# Patient Record
Sex: Female | Born: 1955
Health system: Southern US, Community
[De-identification: ages and names within clinical notes are randomized; demographics above are authoritative.]

## PROBLEM LIST (undated history)

## (undated) DIAGNOSIS — T7840XA Allergy, unspecified, initial encounter: Secondary | ICD-10-CM

## (undated) DIAGNOSIS — F329 Major depressive disorder, single episode, unspecified: Secondary | ICD-10-CM

## (undated) DIAGNOSIS — F419 Anxiety disorder, unspecified: Secondary | ICD-10-CM

## (undated) HISTORY — DX: Anxiety disorder, unspecified: F41.9

## (undated) HISTORY — DX: Major depressive disorder, single episode, unspecified: F32.9

## (undated) HISTORY — PX: TUBAL LIGATION: SHX77

## (undated) HISTORY — PX: COLONOSCOPY: SHX174

## (undated) HISTORY — DX: Allergy, unspecified, initial encounter: T78.40XA

---

## 1997-09-15 ENCOUNTER — Other Ambulatory Visit: Admission: RE | Admit: 1997-09-15 | Discharge: 1997-09-15 | Payer: Self-pay | Admitting: Obstetrics and Gynecology

## 1999-04-12 ENCOUNTER — Encounter: Admission: RE | Admit: 1999-04-12 | Discharge: 1999-04-12 | Payer: Self-pay | Admitting: Obstetrics and Gynecology

## 1999-04-12 ENCOUNTER — Encounter: Payer: Self-pay | Admitting: Obstetrics and Gynecology

## 1999-10-12 ENCOUNTER — Encounter: Payer: Self-pay | Admitting: Obstetrics and Gynecology

## 1999-10-12 ENCOUNTER — Encounter: Admission: RE | Admit: 1999-10-12 | Discharge: 1999-10-12 | Payer: Self-pay | Admitting: Obstetrics and Gynecology

## 2001-05-06 ENCOUNTER — Other Ambulatory Visit: Admission: RE | Admit: 2001-05-06 | Discharge: 2001-05-06 | Payer: Self-pay | Admitting: Obstetrics and Gynecology

## 2002-04-08 ENCOUNTER — Encounter: Payer: Self-pay | Admitting: Obstetrics and Gynecology

## 2002-04-08 ENCOUNTER — Encounter: Admission: RE | Admit: 2002-04-08 | Discharge: 2002-04-08 | Payer: Self-pay | Admitting: Obstetrics and Gynecology

## 2006-01-22 ENCOUNTER — Other Ambulatory Visit: Admission: RE | Admit: 2006-01-22 | Discharge: 2006-01-22 | Payer: Self-pay | Admitting: Family Medicine

## 2006-04-02 LAB — HM MAMMOGRAPHY: HM Mammogram: NORMAL

## 2006-04-02 LAB — HM COLONOSCOPY: HM Colonoscopy: NORMAL

## 2007-02-11 ENCOUNTER — Encounter: Payer: Self-pay | Admitting: Family Medicine

## 2007-03-19 ENCOUNTER — Other Ambulatory Visit: Admission: RE | Admit: 2007-03-19 | Discharge: 2007-03-19 | Payer: Self-pay | Admitting: Family Medicine

## 2007-08-01 LAB — CONVERTED CEMR LAB: Pap Smear: NORMAL

## 2009-01-27 ENCOUNTER — Ambulatory Visit: Payer: Self-pay | Admitting: Family Medicine

## 2009-01-27 DIAGNOSIS — F329 Major depressive disorder, single episode, unspecified: Secondary | ICD-10-CM

## 2009-01-27 DIAGNOSIS — Z8659 Personal history of other mental and behavioral disorders: Secondary | ICD-10-CM

## 2009-01-27 DIAGNOSIS — F3289 Other specified depressive episodes: Secondary | ICD-10-CM

## 2009-01-27 HISTORY — DX: Other specified depressive episodes: F32.89

## 2009-01-27 HISTORY — DX: Major depressive disorder, single episode, unspecified: F32.9

## 2010-06-13 ENCOUNTER — Other Ambulatory Visit: Payer: Self-pay | Admitting: Family Medicine

## 2010-06-27 ENCOUNTER — Encounter: Payer: Self-pay | Admitting: Family Medicine

## 2010-06-29 ENCOUNTER — Ambulatory Visit (INDEPENDENT_AMBULATORY_CARE_PROVIDER_SITE_OTHER): Payer: Self-pay | Admitting: Family Medicine

## 2010-06-29 ENCOUNTER — Encounter: Payer: Self-pay | Admitting: Family Medicine

## 2010-06-29 VITALS — BP 120/80 | Temp 98.6°F | Ht 64.0 in | Wt 167.0 lb

## 2010-06-29 DIAGNOSIS — F329 Major depressive disorder, single episode, unspecified: Secondary | ICD-10-CM

## 2010-06-29 MED ORDER — CITALOPRAM HYDROBROMIDE 40 MG PO TABS: 40.0000 mg | ORAL_TABLET | Freq: Every day | ORAL | Status: DC

## 2010-06-29 NOTE — Progress Notes (Signed)
  Subjective:    Patient ID: Jenny Ewing, female    DOB: [birth year], 55 y.o.   MRN: [id number]  HPI Patient seen for medical followup. History of recurrent depression currently treated citalopram 40 mg daily. Over the years, she has tried several times coming off medication with recurrent depression each time. Current symptoms stable. She exercises 3-4 times per week. Good appetite. Sleep is normal. Denies side effects from medication. Occasionally skips a dose but for the most part compliant.   Review of Systems  Constitutional: Negative for activity change, appetite change, fatigue and unexpected weight change.  Respiratory: Negative for cough and shortness of breath.   Cardiovascular: Negative for chest pain.  Neurological: Negative for weakness.  Psychiatric/Behavioral: Negative for dysphoric mood. The patient is not nervous/anxious.        Objective:   Physical Exam  Constitutional: She is oriented to person, place, and time. She appears well-developed and well-nourished.  HENT:  Head: Normocephalic and atraumatic.  Neck: Neck supple. No thyromegaly present.  Cardiovascular: Normal rate and regular rhythm.   No murmur heard. Pulmonary/Chest: Effort normal and breath sounds normal. She has no wheezes. She has no rales.  Lymphadenopathy:    She has no cervical adenopathy.  Neurological: She is alert and oriented to person, place, and time.          Assessment & Plan:  Recurrent depression. Stable. Refill medication for one year

## 2010-08-25 ENCOUNTER — Ambulatory Visit (INDEPENDENT_AMBULATORY_CARE_PROVIDER_SITE_OTHER): Payer: Self-pay | Admitting: Internal Medicine

## 2010-08-25 DIAGNOSIS — J029 Acute pharyngitis, unspecified: Secondary | ICD-10-CM

## 2010-08-25 MED ORDER — CLINDAMYCIN HCL 300 MG PO CAPS: 300.0000 mg | ORAL_CAPSULE | Freq: Three times a day (TID) | ORAL | Status: AC

## 2010-08-26 ENCOUNTER — Encounter: Payer: Self-pay | Admitting: Internal Medicine

## 2010-08-26 DIAGNOSIS — J029 Acute pharyngitis, unspecified: Secondary | ICD-10-CM | POA: Insufficient documentation

## 2010-08-26 NOTE — Assessment & Plan Note (Signed)
Begin antibiotic therapy. Followup if no improvement or worsening. 

## 2010-08-26 NOTE — Progress Notes (Signed)
  Subjective:    Patient ID: Jenny Ewing, female    DOB: 15-Apr-1955, 55 y.o.   MRN: [id number]  HPI Patient presents to clinic for evaluation of sore throat. Patient notes six-day history of sore throat with subjective fevers and nasal congestion/drainage. Has had sick exposure with positive strep exposure. No alleviating or exacerbating factors. No other complaints.  Reviewed past medical history, medications and allergies    Review of Systems  Constitutional: Positive for fever. Negative for chills.  HENT: Positive for congestion and rhinorrhea.   Respiratory: Negative for cough and wheezing.        Objective:   Physical Exam  Nursing note and vitals reviewed. Constitutional: She appears well-developed and well-nourished. No distress.  HENT:  Head: Normocephalic and atraumatic.  Right Ear: Tympanic membrane, external ear and ear canal normal.  Left Ear: Tympanic membrane, external ear and ear canal normal.  Nose: Nose normal.  Mouth/Throat: Uvula is midline and mucous membranes are normal. Oropharyngeal exudate and posterior oropharyngeal erythema present. No posterior oropharyngeal edema or tonsillar abscesses.  Eyes: Conjunctivae are normal. No scleral icterus.  Neck: Neck supple.  Lymphadenopathy:    She has no cervical adenopathy.  Neurological: She is alert.  Skin: Skin is warm and dry. She is not diaphoretic.  Psychiatric: She has a normal mood and affect.          Assessment & Plan:

## 2011-10-11 ENCOUNTER — Other Ambulatory Visit: Payer: Self-pay | Admitting: Family Medicine

## 2011-12-17 ENCOUNTER — Encounter: Payer: Self-pay | Admitting: Family Medicine

## 2011-12-17 ENCOUNTER — Ambulatory Visit (INDEPENDENT_AMBULATORY_CARE_PROVIDER_SITE_OTHER): Payer: Self-pay | Admitting: Family Medicine

## 2011-12-17 VITALS — BP 130/80 | Temp 98.0°F | Wt 159.0 lb

## 2011-12-17 DIAGNOSIS — F329 Major depressive disorder, single episode, unspecified: Secondary | ICD-10-CM

## 2011-12-17 DIAGNOSIS — F3289 Other specified depressive episodes: Secondary | ICD-10-CM

## 2011-12-17 MED ORDER — CITALOPRAM HYDROBROMIDE 40 MG PO TABS: 40.0000 mg | ORAL_TABLET | Freq: Every day | ORAL | Status: DC

## 2011-12-17 NOTE — Progress Notes (Signed)
  Subjective:    Patient ID: Jenny Ewing, female    DOB: [birth year], 57 y.o.   MRN: [id number]  HPI  History of recurrent depression. Stable on Celexa 40 mg daily. Requesting refills. She feels her anxiety and depression symptoms are stable. She has had some difficulties losing weight over the past year. She is exercising with walking fairly regularly. She has actually lost about 8 pounds due to her efforts since last year. Overall feels well. No side effects from medication. Compliant with therapy. She is very reluctant to discontinue medication. She has tried tapering in the past without success.  She has not had well visit or complete physical in some time but has no insurance and hesitates to schedule this time  Past Medical History  Diagnosis Date  . DEPRESSION 01/27/2009   No past surgical history on file.  reports that she has never smoked. She does not have any smokeless tobacco history on file. Her alcohol and drug histories not on file. family history includes Cancer in her other; Hypertension in her other; and Stroke in her other. Allergies  Allergen Reactions  . Penicillins     REACTION: Hives      Review of Systems  Constitutional: Negative for appetite change and unexpected weight change.  Respiratory: Negative for shortness of breath.   Cardiovascular: Negative for chest pain.  Psychiatric/Behavioral: Negative for dysphoric mood. The patient is not nervous/anxious.        Objective:   Physical Exam  Constitutional: She appears well-developed and well-nourished.  Neck: Neck supple. No thyromegaly present.  Cardiovascular: Normal rate and regular rhythm.   Pulmonary/Chest: Effort normal and breath sounds normal. No respiratory distress. She has no wheezes. She has no rales.  Psychiatric: She has a normal mood and affect. Her behavior is normal.          Assessment & Plan:  History of recurrent depression. Stable. Refill Celexa for one year.

## 2013-02-10 ENCOUNTER — Other Ambulatory Visit: Payer: Self-pay

## 2013-02-10 MED ORDER — CITALOPRAM HYDROBROMIDE 40 MG PO TABS: 40.0000 mg | ORAL_TABLET | Freq: Every day | ORAL | Status: DC

## 2013-03-19 ENCOUNTER — Ambulatory Visit (INDEPENDENT_AMBULATORY_CARE_PROVIDER_SITE_OTHER): Payer: Self-pay | Admitting: Family Medicine

## 2013-03-19 ENCOUNTER — Encounter: Payer: Self-pay | Admitting: Family Medicine

## 2013-03-19 VITALS — BP 130/74 | HR 75 | Temp 98.6°F | Wt 173.0 lb

## 2013-03-19 DIAGNOSIS — F329 Major depressive disorder, single episode, unspecified: Secondary | ICD-10-CM

## 2013-03-19 MED ORDER — CITALOPRAM HYDROBROMIDE 40 MG PO TABS: 40.0000 mg | ORAL_TABLET | Freq: Every day | ORAL | Status: DC

## 2013-03-19 NOTE — Progress Notes (Signed)
   Subjective:    Patient ID: Jenny Ewing, female    DOB: [birth year], 57 y.o.   MRN: [id number]  HPI Patient is here for followup regarding recurrent depression. For several years she has been on Celexa 40 mg daily. Over the years, she has tried several times tapering off but has had recurrent depressive symptoms. No history of any chronic anxiety. No sleep disturbance. Feels well at this time. No depressed mood. No anhedonia. No suicidal thoughts. She denies any side effects from medication. She's compliant with therapy.  She's not had health maintenance exam in several years but has had lack of insurance coverage. This changes in January of next year and she plans to get physical scheduled then.  Past Medical History  Diagnosis Date  . DEPRESSION 01/27/2009   No past surgical history on file.  reports that she has never smoked. She does not have any smokeless tobacco history on file. Her alcohol and drug histories are not on file. family history includes Cancer in her other; Hypertension in her other; Stroke in her other. Allergies  Allergen Reactions  . Penicillins     REACTION: Hives      Review of Systems  Constitutional: Negative for appetite change and unexpected weight change.  Respiratory: Negative for cough and shortness of breath.   Cardiovascular: Negative for chest pain.  Neurological: Negative for dizziness.  Psychiatric/Behavioral: Negative for dysphoric mood and agitation.       Objective:   Physical Exam  Constitutional: She appears well-developed and well-nourished.  Neck: Neck supple. No thyromegaly present.  Cardiovascular: Normal rate and regular rhythm.   Pulmonary/Chest: Effort normal and breath sounds normal. No respiratory distress. She has no wheezes. She has no rales.  Psychiatric: She has a normal mood and affect. Her behavior is normal.          Assessment & Plan:  History of recurrent depression. Currently stable on Celexa. She's tried  tapering several times without success. Refill for one year. She'll schedule complete physical after first of the year.

## 2013-03-19 NOTE — Progress Notes (Signed)
Pre visit review using our clinic review tool, if applicable. No additional management support is needed unless otherwise documented below in the visit note. 

## 2013-06-10 ENCOUNTER — Encounter: Payer: Self-pay | Admitting: Family Medicine

## 2013-06-10 ENCOUNTER — Other Ambulatory Visit (HOSPITAL_COMMUNITY)
Admission: RE | Admit: 2013-06-10 | Discharge: 2013-06-10 | Disposition: A | Discharge status: Home / Self Care | Payer: 59 | Source: Ambulatory Visit | Attending: Family Medicine | Admitting: Family Medicine

## 2013-06-10 ENCOUNTER — Ambulatory Visit (INDEPENDENT_AMBULATORY_CARE_PROVIDER_SITE_OTHER): Payer: 59 | Admitting: Family Medicine

## 2013-06-10 VITALS — BP 132/82 | HR 89 | Temp 98.5°F | Ht 64.0 in | Wt 172.0 lb

## 2013-06-10 DIAGNOSIS — Z0000 Encounter for general adult medical examination without abnormal findings: Secondary | ICD-10-CM

## 2013-06-10 DIAGNOSIS — Z23 Encounter for immunization: Secondary | ICD-10-CM

## 2013-06-10 DIAGNOSIS — Z01419 Encounter for gynecological examination (general) (routine) without abnormal findings: Secondary | ICD-10-CM | POA: Insufficient documentation

## 2013-06-10 LAB — CBC WITH DIFFERENTIAL/PLATELET
BASOS PCT: 0.5 % (ref 0.0–3.0)
Basophils Absolute: 0 10*3/uL (ref 0.0–0.1)
EOS PCT: 3.6 % (ref 0.0–5.0)
Eosinophils Absolute: 0.2 10*3/uL (ref 0.0–0.7)
HCT: 42.8 % (ref 36.0–46.0)
Hemoglobin: 14.4 g/dL (ref 12.0–15.0)
LYMPHS PCT: 31 % (ref 12.0–46.0)
Lymphs Abs: 1.6 10*3/uL (ref 0.7–4.0)
MCHC: 33.6 g/dL (ref 30.0–36.0)
MCV: 95.4 fl (ref 78.0–100.0)
MONO ABS: 0.4 10*3/uL (ref 0.1–1.0)
MONOS PCT: 8.6 % (ref 3.0–12.0)
NEUTROS PCT: 56.3 % (ref 43.0–77.0)
Neutro Abs: 2.9 10*3/uL (ref 1.4–7.7)
PLATELETS: 276 10*3/uL (ref 150.0–400.0)
RBC: 4.49 Mil/uL (ref 3.87–5.11)
RDW: 13 % (ref 11.5–14.6)
WBC: 5.1 10*3/uL (ref 4.5–10.5)

## 2013-06-10 LAB — BASIC METABOLIC PANEL
BUN: 16 mg/dL (ref 6–23)
CALCIUM: 9.3 mg/dL (ref 8.4–10.5)
CHLORIDE: 105 meq/L (ref 96–112)
CO2: 24 mEq/L (ref 19–32)
CREATININE: 0.7 mg/dL (ref 0.4–1.2)
GFR: 85.26 mL/min (ref >60.00)
Glucose, Bld: 92 mg/dL (ref 70–99)
Potassium: 4.1 mEq/L (ref 3.5–5.1)
Sodium: 138 mEq/L (ref 135–145)

## 2013-06-10 LAB — LIPID PANEL
CHOL/HDL RATIO: 3
CHOLESTEROL: 217 mg/dL — AB (ref 0–200)
HDL: 82.7 mg/dL (ref >39.00)
LDL CALC: 125 mg/dL — AB (ref 0–99)
Triglycerides: 46 mg/dL (ref 0.0–149.0)
VLDL: 9.2 mg/dL (ref 0.0–40.0)

## 2013-06-10 LAB — POCT URINALYSIS DIPSTICK
Bilirubin, UA: NEGATIVE
GLUCOSE UA: NEGATIVE
Ketones, UA: NEGATIVE
NITRITE UA: NEGATIVE
Protein, UA: NEGATIVE
Spec Grav, UA: >=1.03
UROBILINOGEN UA: 0.2
pH, UA: 5.5

## 2013-06-10 LAB — HEPATIC FUNCTION PANEL
ALBUMIN: 4.7 g/dL (ref 3.5–5.2)
ALT: 14 U/L (ref 0–35)
AST: 18 U/L (ref 0–37)
Alkaline Phosphatase: 75 U/L (ref 39–117)
Bilirubin, Direct: 0.1 mg/dL (ref 0.0–0.3)
TOTAL PROTEIN: 7.2 g/dL (ref 6.0–8.3)
Total Bilirubin: 0.6 mg/dL (ref 0.3–1.2)

## 2013-06-10 LAB — TSH: TSH: 2.48 u[IU]/mL (ref 0.35–5.50)

## 2013-06-10 NOTE — Addendum Note (Signed)
Addended by: Marcina Millard on: 06/10/2013 09:57 AM   Modules accepted: Orders

## 2013-06-10 NOTE — Patient Instructions (Signed)
Schedule mammogram. We will call you with Pap smear and lab results

## 2013-06-10 NOTE — Progress Notes (Signed)
   Subjective:    Patient ID: Jenny Ewing, female    DOB: 1956/01/21, 58 y.o.   MRN: [id number]  HPI  Patient here for complete physical. She had lack of insurance for several years and has not had a complete physical now in several years. She has history of depression which is stable on Celexa. She takes no other regular medications. She had colonoscopy in 2008 which was normal. She had one grandparent with colon cancer. Last Pap smear 2009. Last mammogram around 2008 or 2009. Patient is a nonsmoker.  Past Medical History  Diagnosis Date  . DEPRESSION 01/27/2009   No past surgical history on file.  reports that she has never smoked. She does not have any smokeless tobacco history on file. Her alcohol and drug histories are not on file. family history includes Cancer in her other; Hypertension in her father, mother, and other; Stroke in her other. Allergies  Allergen Reactions  . Penicillins     REACTION: Hives     Review of Systems  Constitutional: Negative for fever, activity change, appetite change, fatigue and unexpected weight change.  HENT: Negative for ear pain, hearing loss, sore throat and trouble swallowing.   Eyes: Negative for visual disturbance.  Respiratory: Negative for cough and shortness of breath.   Cardiovascular: Negative for chest pain and palpitations.  Gastrointestinal: Negative for abdominal pain, diarrhea, constipation and blood in stool.  Endocrine: Negative for polydipsia and polyuria.  Genitourinary: Negative for dysuria and hematuria.  Musculoskeletal: Negative for arthralgias, back pain and myalgias.  Skin: Negative for rash.  Neurological: Negative for dizziness, syncope and headaches.  Hematological: Negative for adenopathy.  Psychiatric/Behavioral: Negative for confusion and dysphoric mood.       Objective:   Physical Exam  Constitutional: She is oriented to person, place, and time. She appears well-developed and well-nourished.  HENT:    Head: Normocephalic and atraumatic.  Eyes: EOM are normal. Pupils are equal, round, and reactive to light.  Neck: Normal range of motion. Neck supple. No thyromegaly present.  Cardiovascular: Normal rate, regular rhythm and normal heart sounds.   No murmur heard. Pulmonary/Chest: Breath sounds normal. No respiratory distress. She has no wheezes. She has no rales.  Abdominal: Soft. Bowel sounds are normal. She exhibits no distension and no mass. There is no tenderness. There is no rebound and no guarding.  Genitourinary:  Breasts are symmetric with no mass. Pelvic exam normal external genitalia. Cervix normal in appearance. Pap smear obtained. Bimanual exam no mass. No tenderness.  Musculoskeletal: Normal range of motion. She exhibits no edema.  Lymphadenopathy:    She has no cervical adenopathy.  Neurological: She is alert and oriented to person, place, and time. She displays normal reflexes. No cranial nerve deficit.  Skin: No rash noted.  Psychiatric: She has a normal mood and affect. Her behavior is normal. Judgment and thought content normal.          Assessment & Plan:  Complete physical. Obtain screening lab work. Schedule mammogram. Pap smear obtained. Hemoccult cards given. Tetanus booster given

## 2013-06-10 NOTE — Progress Notes (Signed)
Pre visit review using our clinic review tool, if applicable. No additional management support is needed unless otherwise documented below in the visit note. 

## 2013-07-16 ENCOUNTER — Telehealth: Payer: Self-pay

## 2013-07-16 ENCOUNTER — Encounter: Payer: Self-pay | Admitting: Family Medicine

## 2013-07-16 DIAGNOSIS — Z1239 Encounter for other screening for malignant neoplasm of breast: Secondary | ICD-10-CM

## 2013-07-16 NOTE — Telephone Encounter (Signed)
yes

## 2013-07-16 NOTE — Telephone Encounter (Signed)
Pt states her insurance require referrals for mammograms. Is it okay to order a referral for mammogram.

## 2013-07-16 NOTE — Telephone Encounter (Signed)
Referral is ordered

## 2013-08-12 ENCOUNTER — Ambulatory Visit
Admission: RE | Admit: 2013-08-12 | Discharge: 2013-08-12 | Disposition: A | Discharge status: Home / Self Care | Payer: 59 | Source: Ambulatory Visit | Attending: Family Medicine | Admitting: Family Medicine

## 2013-08-12 DIAGNOSIS — Z1239 Encounter for other screening for malignant neoplasm of breast: Secondary | ICD-10-CM

## 2013-08-14 ENCOUNTER — Other Ambulatory Visit: Payer: Self-pay | Admitting: Family Medicine

## 2013-08-14 DIAGNOSIS — R928 Other abnormal and inconclusive findings on diagnostic imaging of breast: Secondary | ICD-10-CM

## 2013-09-03 ENCOUNTER — Ambulatory Visit
Admission: RE | Admit: 2013-09-03 | Discharge: 2013-09-03 | Disposition: A | Discharge status: Home / Self Care | Payer: 59 | Source: Ambulatory Visit | Attending: Family Medicine | Admitting: Family Medicine

## 2013-09-03 DIAGNOSIS — R928 Other abnormal and inconclusive findings on diagnostic imaging of breast: Secondary | ICD-10-CM

## 2013-09-21 ENCOUNTER — Telehealth: Payer: Self-pay | Admitting: Family Medicine

## 2013-09-21 DIAGNOSIS — Z808 Family history of malignant neoplasm of other organs or systems: Secondary | ICD-10-CM

## 2013-09-21 NOTE — Telephone Encounter (Signed)
Pt is requesting a referral to a dermatologist

## 2013-09-21 NOTE — Telephone Encounter (Signed)
OK to refer if she needs referral.  If she does not have insurance will not need referral.  She may have coverage now.

## 2013-09-21 NOTE — Telephone Encounter (Signed)
Referral is ordered

## 2014-08-04 ENCOUNTER — Ambulatory Visit (INDEPENDENT_AMBULATORY_CARE_PROVIDER_SITE_OTHER): Payer: 59 | Admitting: Family Medicine

## 2014-08-04 ENCOUNTER — Encounter: Payer: Self-pay | Admitting: Family Medicine

## 2014-08-04 VITALS — BP 126/78 | HR 77 | Temp 98.6°F | Wt 179.0 lb

## 2014-08-04 DIAGNOSIS — Z8659 Personal history of other mental and behavioral disorders: Secondary | ICD-10-CM | POA: Diagnosis not present

## 2014-08-04 MED ORDER — CITALOPRAM HYDROBROMIDE 40 MG PO TABS: 40.0000 mg | ORAL_TABLET | Freq: Every day | ORAL | Status: DC

## 2014-08-04 NOTE — Progress Notes (Signed)
   Subjective:    Patient ID: Jenny Ewing, female    DOB: 02/13/1956, 59 y.o.   MRN: [id number]  HPI Patient here for follow-up regarding recurrent depression. Treated with Celexa 40 mg daily. She has tried tapering back off this medication in the past without success and she denies any anxiety symptoms. No side effects. She had complete physical last year which was unremarkable.  Past Medical History  Diagnosis Date  . DEPRESSION 01/27/2009   No past surgical history on file.  reports that she has never smoked. She does not have any smokeless tobacco history on file. Her alcohol and drug histories are not on file. family history includes Cancer in her other; Hypertension in her father, mother, and other; Stroke in her other. Allergies  Allergen Reactions  . Penicillins     REACTION: Hives      Review of Systems  Constitutional: Negative for appetite change and unexpected weight change.  Psychiatric/Behavioral: Negative for dysphoric mood and agitation. The patient is not nervous/anxious.        Objective:   Physical Exam  Constitutional: She is oriented to person, place, and time. She appears well-developed and well-nourished.  Cardiovascular: Normal rate and regular rhythm.   Pulmonary/Chest: Effort normal and breath sounds normal. No respiratory distress. She has no wheezes. She has no rales.  Neurological: She is alert and oriented to person, place, and time. No cranial nerve deficit.  Psychiatric: She has a normal mood and affect. Her behavior is normal. Judgment and thought content normal.          Assessment & Plan:  History of recurrent depression. Stable. Refill Celexa for one year. Consider complete physical by next year

## 2014-08-04 NOTE — Progress Notes (Signed)
Pre visit review using our clinic review tool, if applicable. No additional management support is needed unless otherwise documented below in the visit note. 

## 2015-11-16 ENCOUNTER — Ambulatory Visit (INDEPENDENT_AMBULATORY_CARE_PROVIDER_SITE_OTHER): Payer: 59 | Admitting: Family Medicine

## 2015-11-16 VITALS — BP 130/80 | HR 83 | Temp 98.8°F | Ht 64.0 in | Wt 179.5 lb

## 2015-11-16 DIAGNOSIS — Z8659 Personal history of other mental and behavioral disorders: Secondary | ICD-10-CM | POA: Diagnosis not present

## 2015-11-16 MED ORDER — CITALOPRAM HYDROBROMIDE 40 MG PO TABS: 40.0000 mg | ORAL_TABLET | Freq: Every day | ORAL | 3 refills | Status: DC

## 2015-11-16 NOTE — Progress Notes (Signed)
Subjective:     Patient ID: Jenny Ewing, female   DOB: 1955/05/01, 60 y.o.   MRN: [id number]  HPI Patient has history of recurrent depression. She has been for several years on citalopram 40 mg daily. She's tried multiple times tapering off but has had recurrent symptoms. She is here for refills. She feels her anxiety symptoms are currently very stable. No anhedonia, decreased concentration, low motivation, sleep disturbance, or other concern.  She'll be due for repeat colonoscopy by next year. She currently has poor coverage and does not wish to look at things like shingles vaccine at this time.  Past Medical History:  Diagnosis Date  . DEPRESSION 01/27/2009   No past surgical history on file.  reports that she has never smoked. She does not have any smokeless tobacco history on file. Her alcohol and drug histories are not on file. family history includes Cancer in her other; Hypertension in her father, mother, and other; Stroke in her other. Allergies  Allergen Reactions  . Penicillins     REACTION: Hives     Review of Systems  Respiratory: Negative for shortness of breath.   Cardiovascular: Negative for chest pain.  Psychiatric/Behavioral: Negative for dysphoric mood and suicidal ideas.       Objective:   Physical Exam  Constitutional: She appears well-developed and well-nourished.  Neck: Neck supple. No thyromegaly present.  Cardiovascular: Normal rate and regular rhythm.   Pulmonary/Chest: Effort normal and breath sounds normal. No respiratory distress. She has no wheezes. She has no rales.  Psychiatric: She has a normal mood and affect. Her behavior is normal. Judgment and thought content normal.       Assessment:     History of recurrent depression. Stable on Celexa    Plan:     -Refill medication for one year. -Follow-up sooner as needed  Eulas Post MD [facility] at [facility]

## 2015-11-16 NOTE — Progress Notes (Signed)
Pre visit review using our clinic review tool, if applicable. No additional management support is needed unless otherwise documented below in the visit note. 

## 2016-12-20 ENCOUNTER — Encounter: Payer: Self-pay | Admitting: Family Medicine

## 2017-03-07 ENCOUNTER — Other Ambulatory Visit: Payer: Self-pay | Admitting: Family Medicine

## 2017-03-07 NOTE — Telephone Encounter (Signed)
Copied from [place]. Topic: Quick Communication - See Telephone Encounter >> Mar 07, 2017  3:55 PM Synthia Innocent wrote: CRM for notification. See Telephone encounter for: Requesting refill on citalopram (CELEXA) 40 MG tablet, Kristopher Oppenheim [place]  03/07/17.

## 2017-03-12 MED ORDER — CITALOPRAM HYDROBROMIDE 40 MG PO TABS: 40.0000 mg | ORAL_TABLET | Freq: Every day | ORAL | 0 refills | Status: DC

## 2017-03-12 NOTE — Telephone Encounter (Signed)
Rx refilled through appt in January. She will need to keep appt for further refills.  Request was sent to [facility] Nurse Triage on 03/07/17 and not forwarded to our office until today (03/12/17).

## 2017-03-12 NOTE — Telephone Encounter (Signed)
Celexa last refilled: 11/16/15 for 90 tablets x 3 refills. This rx would have lasted until August 2018.  Pt has an office visit: 04/22/17.  Routing to provider.

## 2017-03-12 NOTE — Telephone Encounter (Signed)
Pt following up on celexa refillpt called 12/06 and this has not been addressed.  Pt states she has been out several days.

## 2017-04-22 ENCOUNTER — Ambulatory Visit (INDEPENDENT_AMBULATORY_CARE_PROVIDER_SITE_OTHER): Payer: PRIVATE HEALTH INSURANCE | Admitting: Family Medicine

## 2017-04-22 ENCOUNTER — Encounter: Payer: Self-pay | Admitting: Family Medicine

## 2017-04-22 VITALS — BP 120/80 | HR 100 | Temp 98.3°F | Ht 65.0 in | Wt 168.5 lb

## 2017-04-22 DIAGNOSIS — Z0000 Encounter for general adult medical examination without abnormal findings: Secondary | ICD-10-CM | POA: Diagnosis not present

## 2017-04-22 LAB — CBC WITH DIFFERENTIAL/PLATELET
BASOS PCT: 0.4 % (ref 0.0–3.0)
Basophils Absolute: 0 10*3/uL (ref 0.0–0.1)
Eosinophils Absolute: 0.1 10*3/uL (ref 0.0–0.7)
Eosinophils Relative: 2.3 % (ref 0.0–5.0)
HCT: 45.2 % (ref 36.0–46.0)
HEMOGLOBIN: 15 g/dL (ref 12.0–15.0)
Lymphocytes Relative: 25.8 % (ref 12.0–46.0)
Lymphs Abs: 1.5 10*3/uL (ref 0.7–4.0)
MCHC: 33.2 g/dL (ref 30.0–36.0)
MCV: 94.4 fl (ref 78.0–100.0)
MONO ABS: 0.5 10*3/uL (ref 0.1–1.0)
MONOS PCT: 8.4 % (ref 3.0–12.0)
Neutro Abs: 3.7 10*3/uL (ref 1.4–7.7)
Neutrophils Relative %: 63.1 % (ref 43.0–77.0)
Platelets: 316 10*3/uL (ref 150.0–400.0)
RBC: 4.79 Mil/uL (ref 3.87–5.11)
RDW: 13.1 % (ref 11.5–15.5)
WBC: 5.9 10*3/uL (ref 4.0–10.5)

## 2017-04-22 LAB — HEPATIC FUNCTION PANEL
ALBUMIN: 4.6 g/dL (ref 3.5–5.2)
ALK PHOS: 79 U/L (ref 39–117)
ALT: 12 U/L (ref 0–35)
AST: 17 U/L (ref 0–37)
Bilirubin, Direct: 0.1 mg/dL (ref 0.0–0.3)
Total Bilirubin: 0.7 mg/dL (ref 0.2–1.2)
Total Protein: 6.6 g/dL (ref 6.0–8.3)

## 2017-04-22 LAB — LIPID PANEL
CHOLESTEROL: 200 mg/dL (ref 0–200)
HDL: 70.7 mg/dL (ref >39.00)
LDL Cholesterol: 116 mg/dL — ABNORMAL HIGH (ref 0–99)
NonHDL: 128.85
Total CHOL/HDL Ratio: 3
Triglycerides: 64 mg/dL (ref 0.0–149.0)
VLDL: 12.8 mg/dL (ref 0.0–40.0)

## 2017-04-22 LAB — BASIC METABOLIC PANEL
BUN: 18 mg/dL (ref 6–23)
CHLORIDE: 102 meq/L (ref 96–112)
CO2: 27 mEq/L (ref 19–32)
Calcium: 9.8 mg/dL (ref 8.4–10.5)
Creatinine, Ser: 0.74 mg/dL (ref 0.40–1.20)
GFR: 84.18 mL/min (ref >60.00)
Glucose, Bld: 72 mg/dL (ref 70–99)
Potassium: 4.3 mEq/L (ref 3.5–5.1)
SODIUM: 140 meq/L (ref 135–145)

## 2017-04-22 LAB — TSH: TSH: 2.83 u[IU]/mL (ref 0.35–4.50)

## 2017-04-22 MED ORDER — CITALOPRAM HYDROBROMIDE 40 MG PO TABS: 40.0000 mg | ORAL_TABLET | Freq: Every day | ORAL | 3 refills | Status: DC

## 2017-04-22 NOTE — Patient Instructions (Signed)
Consider Colonoscopy vs Cologuard and let me know after checking with insurance. Set up repeat Mammogram Consider new shingles vaccine (Shingrix).

## 2017-04-22 NOTE — Progress Notes (Signed)
Subjective:     Patient ID: Jenny Ewing, female   DOB: 08/01/1955, 62 y.o.   MRN: [id number]  HPI Patient here for physical exam. She has history of recurrent depression and is on Celexa. Mood is stable. She takes no other medications. She is overdue for several health maintenance items. She declines flu vaccine. No mammogram in over 3 years. Last colonoscopy over 10 years ago. No history of shingles vaccine. Low risk for hepatitis C. Last Pap smear little over 3 years ago. Low risk.  Never smoked. Appetite and weight are stable. Family history reviewed and as below  Chronic intermittent low back pain and she is followed by a chiropractor for that.  Past Medical History:  Diagnosis Date  . DEPRESSION 01/27/2009   No past surgical history on file.  reports that  has never smoked. she has never used smokeless tobacco. Her alcohol and drug histories are not on file. family history includes Cancer in her other; Hypertension in her father, mother, and other; Stroke in her other. Allergies  Allergen Reactions  . Penicillins     REACTION: Hives     Review of Systems  Constitutional: Negative for activity change, appetite change, fatigue, fever and unexpected weight change.  HENT: Negative for ear pain, hearing loss, sore throat and trouble swallowing.   Eyes: Negative for visual disturbance.  Respiratory: Negative for cough and shortness of breath.   Cardiovascular: Negative for chest pain and palpitations.  Gastrointestinal: Negative for abdominal pain, blood in stool, constipation and diarrhea.  Endocrine: Negative for polydipsia and polyuria.  Genitourinary: Negative for dysuria and hematuria.  Musculoskeletal: Positive for back pain. Negative for arthralgias and myalgias.  Skin: Negative for rash.  Neurological: Negative for dizziness, syncope and headaches.  Hematological: Negative for adenopathy.  Psychiatric/Behavioral: Negative for confusion and dysphoric mood.        Objective:   Physical Exam  Constitutional: She is oriented to person, place, and time. She appears well-developed and well-nourished.  HENT:  Head: Normocephalic and atraumatic.  Eyes: EOM are normal. Pupils are equal, round, and reactive to light.  Neck: Normal range of motion. Neck supple. No thyromegaly present.  Cardiovascular: Normal rate, regular rhythm and normal heart sounds.  No murmur heard. Pulmonary/Chest: Breath sounds normal. No respiratory distress. She has no wheezes. She has no rales.  Abdominal: Soft. Bowel sounds are normal. She exhibits no distension and no mass. There is no tenderness. There is no rebound and no guarding.  Musculoskeletal: Normal range of motion. She exhibits no edema.  Lymphadenopathy:    She has no cervical adenopathy.  Neurological: She is alert and oriented to person, place, and time. She displays normal reflexes. No cranial nerve deficit.  Skin: No rash noted.  Psychiatric: She has a normal mood and affect. Her behavior is normal. Judgment and thought content normal.       Assessment:     Physical exam. Several health maintenance issues addressed as below    Plan:     -Obtain screening labs including hepatitis C antibody -Flu vaccine recommended but declined -We recommended either colonoscopy or cologuard. Patient was given information regarding cologuard. She will check with insurance coverage first -Set up repeat mammogram -Discussed new shingles vaccine and she will check on coverage -We discussed repeat Pap smear. She declines at this time but agrees to consider by next year. She is low risk.  Eulas Post MD [facility] at [facility]

## 2017-04-23 LAB — HEPATITIS C ANTIBODY
Hepatitis C Ab: NONREACTIVE
SIGNAL TO CUT-OFF: 0.01 (ref <1.00)

## 2017-07-28 ENCOUNTER — Observation Stay (HOSPITAL_COMMUNITY)
Admission: EM | Admit: 2017-07-28 | Discharge: 2017-07-29 | Disposition: A | Discharge status: Home / Self Care | Payer: PRIVATE HEALTH INSURANCE | Attending: Surgery | Admitting: Surgery

## 2017-07-28 ENCOUNTER — Encounter (HOSPITAL_COMMUNITY): Payer: Self-pay | Admitting: Emergency Medicine

## 2017-07-28 ENCOUNTER — Other Ambulatory Visit: Payer: Self-pay

## 2017-07-28 DIAGNOSIS — Z79899 Other long term (current) drug therapy: Secondary | ICD-10-CM | POA: Diagnosis not present

## 2017-07-28 DIAGNOSIS — F329 Major depressive disorder, single episode, unspecified: Secondary | ICD-10-CM | POA: Diagnosis not present

## 2017-07-28 DIAGNOSIS — K3580 Unspecified acute appendicitis: Secondary | ICD-10-CM | POA: Diagnosis present

## 2017-07-28 DIAGNOSIS — Z880 Allergy status to penicillin: Secondary | ICD-10-CM | POA: Insufficient documentation

## 2017-07-28 DIAGNOSIS — K5730 Diverticulosis of large intestine without perforation or abscess without bleeding: Secondary | ICD-10-CM | POA: Insufficient documentation

## 2017-07-28 DIAGNOSIS — K35891 Other acute appendicitis without perforation, with gangrene: Principal | ICD-10-CM | POA: Insufficient documentation

## 2017-07-28 DIAGNOSIS — K3589 Other acute appendicitis without perforation or gangrene: Secondary | ICD-10-CM

## 2017-07-28 LAB — CBC
HEMATOCRIT: 42.3 % (ref 36.0–46.0)
HEMOGLOBIN: 13.5 g/dL (ref 12.0–15.0)
MCH: 31 pg (ref 26.0–34.0)
MCHC: 31.9 g/dL (ref 30.0–36.0)
MCV: 97 fL (ref 78.0–100.0)
Platelets: 272 10*3/uL (ref 150–400)
RBC: 4.36 MIL/uL (ref 3.87–5.11)
RDW: 13.5 % (ref 11.5–15.5)
WBC: 13.8 10*3/uL — ABNORMAL HIGH (ref 4.0–10.5)

## 2017-07-28 LAB — URINALYSIS, ROUTINE W REFLEX MICROSCOPIC
BACTERIA UA: NONE SEEN
BILIRUBIN URINE: NEGATIVE
Glucose, UA: NEGATIVE mg/dL
Ketones, ur: 80 mg/dL — AB
NITRITE: NEGATIVE
PROTEIN: 30 mg/dL — AB
SPECIFIC GRAVITY, URINE: 1.025 (ref 1.005–1.030)
pH: 5 (ref 5.0–8.0)

## 2017-07-28 LAB — COMPREHENSIVE METABOLIC PANEL
ALBUMIN: 4.1 g/dL (ref 3.5–5.0)
ALT: 17 U/L (ref 14–54)
ANION GAP: 19 — AB (ref 5–15)
AST: 20 U/L (ref 15–41)
Alkaline Phosphatase: 64 U/L (ref 38–126)
BUN: 11 mg/dL (ref 6–20)
CHLORIDE: 102 mmol/L (ref 101–111)
CO2: 19 mmol/L — ABNORMAL LOW (ref 22–32)
Calcium: 8.9 mg/dL (ref 8.9–10.3)
Creatinine, Ser: 0.85 mg/dL (ref 0.44–1.00)
GFR calc Af Amer: >60 mL/min (ref >60)
GFR calc non Af Amer: >60 mL/min (ref >60)
Glucose, Bld: 87 mg/dL (ref 65–99)
POTASSIUM: 4.1 mmol/L (ref 3.5–5.1)
SODIUM: 140 mmol/L (ref 135–145)
Total Bilirubin: 1.2 mg/dL (ref 0.3–1.2)
Total Protein: 6.8 g/dL (ref 6.5–8.1)

## 2017-07-28 LAB — LIPASE, BLOOD: LIPASE: 18 U/L (ref 11–51)

## 2017-07-28 MED ORDER — MORPHINE SULFATE (PF) 4 MG/ML IV SOLN
4.0000 mg | Freq: Once | INTRAVENOUS | Status: AC
  Administered 2017-07-28: 4 mg via INTRAVENOUS
  Filled 2017-07-28: qty 1

## 2017-07-28 MED ORDER — OXYCODONE-ACETAMINOPHEN 5-325 MG PO TABS
1.0000 | ORAL_TABLET | ORAL | Status: DC | PRN
  Administered 2017-07-28: 1 via ORAL
  Filled 2017-07-28: qty 1

## 2017-07-28 MED ORDER — ONDANSETRON HCL 4 MG/2ML IJ SOLN
4.0000 mg | Freq: Once | INTRAMUSCULAR | Status: AC
  Administered 2017-07-28: 4 mg via INTRAVENOUS
  Filled 2017-07-28: qty 2

## 2017-07-28 NOTE — ED Provider Notes (Signed)
[facility] EMERGENCY DEPARTMENT Provider Note   CSN: [id number] Arrival date & time: 07/28/17  1600     History   Chief Complaint Chief Complaint  Patient presents with  . Abdominal Pain    HPI Jenny Ewing is a 62 y.o. female presenting for evaluation of abdominal pain.  Patient states that when she woke up this morning, she had lower abdominal pain.  It worsened throughout the day, and is now radiating around to her back.  She reports pain is a constant cramping pain with intermittent sharp pains.  Nothing has made it better, movement makes it worse.  She denies history of similar.  She states she is had chills without fever.  She has associated nausea without vomiting.  She denies history of abdominal problems, or prior history of abdominal surgeries.  Pain is worse in the right lower side.  No change with urination or bowel movements.  She denies dysuria, hematuria, or urinary frequency.  No blood in her stool.  She denies recent travel.  No sick contacts.  She denies vaginal discharge or bleeding. No HA's, CP, SOB, cough.   HPI  Past Medical History:  Diagnosis Date  . DEPRESSION 01/27/2009    Patient Active Problem List   Diagnosis Date Noted  . Pharyngitis 08/26/2010  . History of depression 01/27/2009    No past surgical history on file.   OB History   None      Home Medications    Prior to Admission medications   Medication Sig Start Date End Date Taking? Authorizing Provider  aspirin 81 MG tablet Take 81 mg by mouth daily.      [provider]  citalopram (CELEXA) 40 MG tablet Take 1 tablet (40 mg total) by mouth daily. 04/22/17   Burchette, Alinda Sierras, MD  fish oil-omega-3 fatty acids 1000 MG capsule Take 2 g by mouth daily.      [provider]  Multiple Vitamins-Minerals (WOMENS MULTI VITAMIN & MINERAL PO) Take by mouth daily.      [provider]    Family History Family History  Problem Relation Age of  Onset  . Cancer Other        colon  . Hypertension Other   . Stroke Other   . Hypertension Mother   . Hypertension Father     Social History Social History   Tobacco Use  . Smoking status: Never Smoker  . Smokeless tobacco: Never Used  Substance Use Topics  . Alcohol use: Not on file  . Drug use: Not on file     Allergies   Penicillins   Review of Systems Review of Systems  Gastrointestinal: Positive for abdominal pain and nausea.  All other systems reviewed and are negative.    Physical Exam Updated Vital Signs BP 118/69 (BP Location: Right Arm)   Pulse 79   Temp 98.3 F (36.8 C) (Oral)   Resp 18   SpO2 99%   Physical Exam  Constitutional: She is oriented to person, place, and time. She appears well-developed and well-nourished. No distress.  Patient appears uncomfortable due to pain.  No acute distress.  HENT:  Head: Normocephalic and atraumatic.  Eyes: Pupils are equal, round, and reactive to light. Conjunctivae and EOM are normal.  Neck: Normal range of motion. Neck supple.  Cardiovascular: Normal rate, regular rhythm and intact distal pulses.  Pulmonary/Chest: Effort normal and breath sounds normal. No respiratory distress. She has no wheezes.  Abdominal: Soft. Bowel  sounds are normal. She exhibits no distension and no mass. There is tenderness in the right lower quadrant, suprapubic area and left lower quadrant. There is guarding and tenderness at McBurney's point. There is no rigidity, no rebound and no CVA tenderness.  Tenderness to palpation of lower abdomen, worse in the right lower quadrant.  Positive guarding.  No rigidity or distention.  Negative rebound.  Negative psoas sign.  Musculoskeletal: Normal range of motion.  Neurological: She is alert and oriented to person, place, and time.  Skin: Skin is warm and dry.  Psychiatric: She has a normal mood and affect.  Nursing note and vitals reviewed.    ED Treatments / Results  Labs (all labs  ordered are listed, but only abnormal results are displayed) Labs Reviewed  COMPREHENSIVE METABOLIC PANEL - Abnormal; Notable for the following components:      Result Value   CO2 19 (*)    Anion gap 19 (*)    All other components within normal limits  CBC - Abnormal; Notable for the following components:   WBC 13.8 (*)    All other components within normal limits  URINALYSIS, ROUTINE W REFLEX MICROSCOPIC - Abnormal; Notable for the following components:   APPearance HAZY (*)    Hgb urine dipstick SMALL (*)    Ketones, ur 80 (*)    Protein, ur 30 (*)    Leukocytes, UA SMALL (*)    All other components within normal limits  LIPASE, BLOOD    EKG None  Radiology No results found.  Procedures Procedures (including critical care time)  Medications Ordered in ED Medications  oxyCODONE-acetaminophen (PERCOCET/ROXICET) 5-325 MG per tablet 1 tablet (1 tablet Oral Given 07/28/17 1627)  morphine 4 MG/ML injection 4 mg (4 mg Intravenous Given 07/28/17 2329)  ondansetron (ZOFRAN) injection 4 mg (4 mg Intravenous Given 07/28/17 2329)     Initial Impression / Assessment and Plan / ED Course  I have reviewed the triage vital signs and the nursing notes.  Pertinent labs & imaging results that were available during my care of the patient were reviewed by me and considered in my medical decision making (see chart for details).     Pt presenting for evaluation of abdominal pain.  Physical exam shows patient who appears in comfortable due to pain, no acute distress.  She is afebrile not tachycardic.  Tenderness palpation of right lower quadrant with guarding.  Labs show mild leukocytosis at 13.7.  Otherwise reassuring.  UA with small blood and small leuks.  Will obtain CT to rule out appendicitis or other acute intra-abdominal pathology.  Pain not improved with morphine. Will give 1 dose dilaudid.   Pt signed out to R. Browning, PA-C. Plan for d/c if ct is negative.   Final Clinical  Impressions(s) / ED Diagnoses   Final diagnoses:  None    ED Discharge Orders    None       Franchot Heidelberg, PA-C 07/29/17 0054    Ripley Fraise, MD 07/29/17 [phone]

## 2017-07-28 NOTE — ED Triage Notes (Signed)
Pt states new onset of lower generalized abdominal pain around 0830. Last BM was today, normal stools. No blood. Denies diarrhea, denies vomiting, does endorse some nausea. No pain with urination. Pain radiates into right lower back. Slightly tender to palpation. Denies vaginal symptoms.

## 2017-07-29 ENCOUNTER — Observation Stay (HOSPITAL_COMMUNITY): Payer: PRIVATE HEALTH INSURANCE | Admitting: Certified Registered Nurse Anesthetist

## 2017-07-29 ENCOUNTER — Encounter (HOSPITAL_COMMUNITY): Payer: Self-pay

## 2017-07-29 ENCOUNTER — Encounter (HOSPITAL_COMMUNITY)
Admission: EM | Disposition: A | Discharge status: Home / Self Care | Payer: Self-pay | Source: Home / Self Care | Attending: Emergency Medicine

## 2017-07-29 ENCOUNTER — Emergency Department (HOSPITAL_COMMUNITY): Payer: PRIVATE HEALTH INSURANCE

## 2017-07-29 DIAGNOSIS — K3580 Unspecified acute appendicitis: Secondary | ICD-10-CM | POA: Diagnosis present

## 2017-07-29 HISTORY — PX: APPENDECTOMY: SHX54

## 2017-07-29 HISTORY — PX: LAPAROSCOPIC APPENDECTOMY: SHX408

## 2017-07-29 SURGERY — APPENDECTOMY, LAPAROSCOPIC
Anesthesia: General | Site: Abdomen

## 2017-07-29 MED ORDER — LACTATED RINGERS IV SOLN
INTRAVENOUS | Status: DC
  Administered 2017-07-29: 09:00:00 via INTRAVENOUS

## 2017-07-29 MED ORDER — DEXAMETHASONE SODIUM PHOSPHATE 4 MG/ML IJ SOLN
INTRAMUSCULAR | Status: DC | PRN
  Administered 2017-07-29: 5 mg via INTRAVENOUS

## 2017-07-29 MED ORDER — TRAMADOL HCL 50 MG PO TABS: 50.0000 mg | ORAL_TABLET | Freq: Four times a day (QID) | ORAL | Status: DC | PRN

## 2017-07-29 MED ORDER — OXYCODONE HCL 5 MG PO TABS: 5.0000 mg | ORAL_TABLET | ORAL | 0 refills | Status: DC | PRN

## 2017-07-29 MED ORDER — PROPOFOL 10 MG/ML IV BOLUS
INTRAVENOUS | Status: AC
  Filled 2017-07-29: qty 20

## 2017-07-29 MED ORDER — DEXAMETHASONE SODIUM PHOSPHATE 10 MG/ML IJ SOLN
INTRAMUSCULAR | Status: AC
  Filled 2017-07-29: qty 2

## 2017-07-29 MED ORDER — LIDOCAINE 2% (20 MG/ML) 5 ML SYRINGE
INTRAMUSCULAR | Status: AC
  Filled 2017-07-29: qty 10

## 2017-07-29 MED ORDER — PROPOFOL 10 MG/ML IV BOLUS
INTRAVENOUS | Status: DC | PRN
  Administered 2017-07-29: 120 mg via INTRAVENOUS

## 2017-07-29 MED ORDER — BUPIVACAINE-EPINEPHRINE 0.25% -1:200000 IJ SOLN
INTRAMUSCULAR | Status: DC | PRN
  Administered 2017-07-29: 11 mL

## 2017-07-29 MED ORDER — GABAPENTIN 300 MG PO CAPS
300.0000 mg | ORAL_CAPSULE | Freq: Two times a day (BID) | ORAL | Status: DC
  Administered 2017-07-29: 300 mg via ORAL
  Filled 2017-07-29: qty 1

## 2017-07-29 MED ORDER — 0.9 % SODIUM CHLORIDE (POUR BTL) OPTIME
TOPICAL | Status: DC | PRN
  Administered 2017-07-29: 1000 mL

## 2017-07-29 MED ORDER — FENTANYL CITRATE (PF) 250 MCG/5ML IJ SOLN
INTRAMUSCULAR | Status: AC
  Filled 2017-07-29: qty 5

## 2017-07-29 MED ORDER — ONDANSETRON HCL 4 MG/2ML IJ SOLN
INTRAMUSCULAR | Status: AC
  Filled 2017-07-29: qty 4

## 2017-07-29 MED ORDER — ACETAMINOPHEN 650 MG RE SUPP: 650.0000 mg | Freq: Four times a day (QID) | RECTAL | Status: DC | PRN

## 2017-07-29 MED ORDER — LIDOCAINE HCL (CARDIAC) PF 100 MG/5ML IV SOSY
PREFILLED_SYRINGE | INTRAVENOUS | Status: DC | PRN
  Administered 2017-07-29: 100 mg via INTRAVENOUS

## 2017-07-29 MED ORDER — OXYCODONE HCL 5 MG PO TABS: 5.0000 mg | ORAL_TABLET | ORAL | Status: DC | PRN

## 2017-07-29 MED ORDER — ACETAMINOPHEN 325 MG PO TABS: 650.0000 mg | ORAL_TABLET | Freq: Four times a day (QID) | ORAL | Status: DC | PRN

## 2017-07-29 MED ORDER — FENTANYL CITRATE (PF) 250 MCG/5ML IJ SOLN
INTRAMUSCULAR | Status: DC | PRN
  Administered 2017-07-29: 150 ug via INTRAVENOUS

## 2017-07-29 MED ORDER — BUPIVACAINE-EPINEPHRINE (PF) 0.25% -1:200000 IJ SOLN
INTRAMUSCULAR | Status: AC
  Filled 2017-07-29: qty 30

## 2017-07-29 MED ORDER — PHENYLEPHRINE 40 MCG/ML (10ML) SYRINGE FOR IV PUSH (FOR BLOOD PRESSURE SUPPORT)
PREFILLED_SYRINGE | INTRAVENOUS | Status: AC
  Filled 2017-07-29: qty 10

## 2017-07-29 MED ORDER — STERILE WATER FOR IRRIGATION IR SOLN
Status: DC | PRN
  Administered 2017-07-29: 1000 mL

## 2017-07-29 MED ORDER — MIDAZOLAM HCL 2 MG/2ML IJ SOLN
INTRAMUSCULAR | Status: AC
  Filled 2017-07-29: qty 2

## 2017-07-29 MED ORDER — ENSURE PRE-SURGERY PO LIQD: 296.0000 mL | Freq: Once | ORAL | Status: DC

## 2017-07-29 MED ORDER — METRONIDAZOLE IN NACL 5-0.79 MG/ML-% IV SOLN: 500.0000 mg | Freq: Three times a day (TID) | INTRAVENOUS | Status: DC

## 2017-07-29 MED ORDER — ONDANSETRON HCL 4 MG/2ML IJ SOLN
INTRAMUSCULAR | Status: DC | PRN
  Administered 2017-07-29: 4 mg via INTRAVENOUS

## 2017-07-29 MED ORDER — KCL IN DEXTROSE-NACL 40-5-0.9 MEQ/L-%-% IV SOLN
INTRAVENOUS | Status: DC
  Administered 2017-07-29 (×2): via INTRAVENOUS
  Filled 2017-07-29 (×3): qty 1000

## 2017-07-29 MED ORDER — DIPHENHYDRAMINE HCL 12.5 MG/5ML PO ELIX: 12.5000 mg | ORAL_SOLUTION | Freq: Four times a day (QID) | ORAL | Status: DC | PRN

## 2017-07-29 MED ORDER — HYDROMORPHONE HCL 2 MG/ML IJ SOLN
1.0000 mg | INTRAMUSCULAR | Status: DC | PRN
  Administered 2017-07-29 (×2): 2 mg via INTRAVENOUS
  Filled 2017-07-29 (×2): qty 1

## 2017-07-29 MED ORDER — SODIUM CHLORIDE 0.9 % IR SOLN
Status: DC | PRN
  Administered 2017-07-29: 1000 mL

## 2017-07-29 MED ORDER — ROCURONIUM BROMIDE 10 MG/ML (PF) SYRINGE
PREFILLED_SYRINGE | INTRAVENOUS | Status: AC
  Filled 2017-07-29: qty 10

## 2017-07-29 MED ORDER — DIPHENHYDRAMINE HCL 50 MG/ML IJ SOLN: 12.5000 mg | Freq: Four times a day (QID) | INTRAMUSCULAR | Status: DC | PRN

## 2017-07-29 MED ORDER — ONDANSETRON HCL 4 MG/2ML IJ SOLN: 4.0000 mg | Freq: Four times a day (QID) | INTRAMUSCULAR | Status: DC | PRN

## 2017-07-29 MED ORDER — IOPAMIDOL (ISOVUE-300) INJECTION 61%
100.0000 mL | Freq: Once | INTRAVENOUS | Status: AC | PRN
  Administered 2017-07-29: 100 mL via INTRAVENOUS

## 2017-07-29 MED ORDER — ENOXAPARIN SODIUM 40 MG/0.4ML ~~LOC~~ SOLN: 40.0000 mg | SUBCUTANEOUS | Status: DC

## 2017-07-29 MED ORDER — KETOROLAC TROMETHAMINE 30 MG/ML IJ SOLN
INTRAMUSCULAR | Status: DC | PRN
  Administered 2017-07-29: 30 mg via INTRAVENOUS

## 2017-07-29 MED ORDER — CIPROFLOXACIN IN D5W 400 MG/200ML IV SOLN
400.0000 mg | Freq: Two times a day (BID) | INTRAVENOUS | Status: DC
  Administered 2017-07-29: 400 mg via INTRAVENOUS
  Filled 2017-07-29 (×3): qty 200

## 2017-07-29 MED ORDER — HYDROMORPHONE HCL 2 MG/ML IJ SOLN
1.0000 mg | Freq: Once | INTRAMUSCULAR | Status: AC
Start: 2017-07-29 — End: 2017-07-29
  Administered 2017-07-29: 1 mg via INTRAVENOUS
  Filled 2017-07-29: qty 1

## 2017-07-29 MED ORDER — ROCURONIUM BROMIDE 100 MG/10ML IV SOLN
INTRAVENOUS | Status: DC | PRN
  Administered 2017-07-29: 10 mg via INTRAVENOUS
  Administered 2017-07-29: 30 mg via INTRAVENOUS

## 2017-07-29 MED ORDER — MIDAZOLAM HCL 5 MG/5ML IJ SOLN
INTRAMUSCULAR | Status: DC | PRN
  Administered 2017-07-29: 2 mg via INTRAVENOUS

## 2017-07-29 MED ORDER — SUGAMMADEX SODIUM 200 MG/2ML IV SOLN
INTRAVENOUS | Status: AC
  Filled 2017-07-29: qty 2

## 2017-07-29 MED ORDER — CITALOPRAM HYDROBROMIDE 20 MG PO TABS
40.0000 mg | ORAL_TABLET | Freq: Every day | ORAL | Status: DC
  Administered 2017-07-29: 40 mg via ORAL
  Filled 2017-07-29: qty 2

## 2017-07-29 MED ORDER — HYDROMORPHONE HCL 2 MG/ML IJ SOLN: 1.0000 mg | Freq: Once | INTRAMUSCULAR | Status: DC | PRN

## 2017-07-29 MED ORDER — SUGAMMADEX SODIUM 200 MG/2ML IV SOLN
INTRAVENOUS | Status: DC | PRN
  Administered 2017-07-29: 150 mg via INTRAVENOUS

## 2017-07-29 MED ORDER — MORPHINE SULFATE (PF) 4 MG/ML IV SOLN: 2.0000 mg | INTRAVENOUS | Status: DC | PRN

## 2017-07-29 MED ORDER — CELECOXIB 200 MG PO CAPS
200.0000 mg | ORAL_CAPSULE | Freq: Two times a day (BID) | ORAL | Status: DC
  Administered 2017-07-29: 200 mg via ORAL
  Filled 2017-07-29: qty 1

## 2017-07-29 MED ORDER — ONDANSETRON 4 MG PO TBDP: 4.0000 mg | ORAL_TABLET | Freq: Four times a day (QID) | ORAL | Status: DC | PRN

## 2017-07-29 MED ORDER — METRONIDAZOLE IN NACL 5-0.79 MG/ML-% IV SOLN
500.0000 mg | Freq: Three times a day (TID) | INTRAVENOUS | Status: DC
  Administered 2017-07-29 (×2): 500 mg via INTRAVENOUS
  Filled 2017-07-29 (×4): qty 100

## 2017-07-29 MED ORDER — CIPROFLOXACIN IN D5W 400 MG/200ML IV SOLN: 400.0000 mg | Freq: Two times a day (BID) | INTRAVENOUS | Status: DC

## 2017-07-29 MED ORDER — SUCCINYLCHOLINE CHLORIDE 20 MG/ML IJ SOLN
INTRAMUSCULAR | Status: DC | PRN
  Administered 2017-07-29: 120 mg via INTRAVENOUS

## 2017-07-29 SURGICAL SUPPLY — 56 items
APL SKNCLS STERI-STRIP NONHPOA (GAUZE/BANDAGES/DRESSINGS) ×1
APPLIER CLIP ROT 10 11.4 M/L (STAPLE)
APR CLP MED LRG 11.4X10 (STAPLE)
BAG SPEC RTRVL LRG 6X4 10 (ENDOMECHANICALS) ×1
BENZOIN TINCTURE PRP APPL 2/3 (GAUZE/BANDAGES/DRESSINGS) ×3 IMPLANT
CANISTER SUCT 3000ML PPV (MISCELLANEOUS) ×2 IMPLANT
CHLORAPREP W/TINT 26ML (MISCELLANEOUS) ×3 IMPLANT
CLIP APPLIE ROT 10 11.4 M/L (STAPLE) IMPLANT
CLOSURE WOUND 1/2 X4 (GAUZE/BANDAGES/DRESSINGS) ×1
COVER SURGICAL LIGHT HANDLE (MISCELLANEOUS) ×3 IMPLANT
CUTTER FLEX LINEAR 45M (STAPLE) ×3 IMPLANT
DRSG TEGADERM 2-3/8X2-3/4 SM (GAUZE/BANDAGES/DRESSINGS) ×6 IMPLANT
DRSG TEGADERM 4X4.75 (GAUZE/BANDAGES/DRESSINGS) ×3 IMPLANT
ELECT REM PT RETURN 9FT ADLT (ELECTROSURGICAL) ×3
ELECTRODE REM PT RTRN 9FT ADLT (ELECTROSURGICAL) ×1 IMPLANT
ENDOLOOP SUT PDS II  0 18 (SUTURE)
ENDOLOOP SUT PDS II 0 18 (SUTURE) IMPLANT
FILTER SMOKE EVAC LAPAROSHD (FILTER) ×3 IMPLANT
GAUZE SPONGE 2X2 8PLY STRL LF (GAUZE/BANDAGES/DRESSINGS) ×1 IMPLANT
GLOVE BIO SURGEON STRL SZ7 (GLOVE) ×3 IMPLANT
GLOVE BIOGEL PI IND STRL 6.5 (GLOVE) IMPLANT
GLOVE BIOGEL PI IND STRL 7.0 (GLOVE) IMPLANT
GLOVE BIOGEL PI IND STRL 7.5 (GLOVE) ×1 IMPLANT
GLOVE BIOGEL PI IND STRL 8 (GLOVE) IMPLANT
GLOVE BIOGEL PI INDICATOR 6.5 (GLOVE) ×2
GLOVE BIOGEL PI INDICATOR 7.0 (GLOVE) ×2
GLOVE BIOGEL PI INDICATOR 7.5 (GLOVE) ×2
GLOVE BIOGEL PI INDICATOR 8 (GLOVE) ×4
GLOVE ECLIPSE 6.0 STRL STRAW (GLOVE) ×2 IMPLANT
GLOVE SURG SS PI 6.5 STRL IVOR (GLOVE) ×2 IMPLANT
GLOVE SURG SS PI 8.0 STRL IVOR (GLOVE) ×2 IMPLANT
GOWN STRL REUS W/ TWL LRG LVL3 (GOWN DISPOSABLE) ×3 IMPLANT
GOWN STRL REUS W/ TWL XL LVL3 (GOWN DISPOSABLE) IMPLANT
GOWN STRL REUS W/TWL LRG LVL3 (GOWN DISPOSABLE) ×9
GOWN STRL REUS W/TWL XL LVL3 (GOWN DISPOSABLE) ×3
KIT BASIN OR (CUSTOM PROCEDURE TRAY) ×3 IMPLANT
KIT TURNOVER KIT B (KITS) ×3 IMPLANT
NS IRRIG 1000ML POUR BTL (IV SOLUTION) ×3 IMPLANT
PAD ARMBOARD 7.5X6 YLW CONV (MISCELLANEOUS) ×6 IMPLANT
POUCH SPECIMEN RETRIEVAL 10MM (ENDOMECHANICALS) ×3 IMPLANT
RELOAD STAPLE 45 3.5 BLU ETS (ENDOMECHANICALS) ×1 IMPLANT
RELOAD STAPLE TA45 3.5 REG BLU (ENDOMECHANICALS) ×3 IMPLANT
SCISSORS ENDO CVD 5DCS (MISCELLANEOUS) IMPLANT
SET IRRIG TUBING LAPAROSCOPIC (IRRIGATION / IRRIGATOR) ×2 IMPLANT
SHEARS HARMONIC ACE PLUS 36CM (ENDOMECHANICALS) ×3 IMPLANT
SLEEVE ENDOPATH XCEL 5M (ENDOMECHANICALS) ×3 IMPLANT
SPECIMEN JAR SMALL (MISCELLANEOUS) ×3 IMPLANT
SPONGE GAUZE 2X2 STER 10/PKG (GAUZE/BANDAGES/DRESSINGS) ×2
STRIP CLOSURE SKIN 1/2X4 (GAUZE/BANDAGES/DRESSINGS) ×2 IMPLANT
SUT MNCRL AB 4-0 PS2 18 (SUTURE) ×3 IMPLANT
TOWEL GREEN STERILE FF (TOWEL DISPOSABLE) ×2 IMPLANT
TRAY LAPAROSCOPIC MC (CUSTOM PROCEDURE TRAY) ×3 IMPLANT
TROCAR XCEL BLUNT TIP 100MML (ENDOMECHANICALS) ×3 IMPLANT
TROCAR XCEL NON-BLD 5MMX100MML (ENDOMECHANICALS) ×3 IMPLANT
TUBING INSUFFLATION (TUBING) ×3 IMPLANT
WATER STERILE IRR 1000ML POUR (IV SOLUTION) ×3 IMPLANT

## 2017-07-29 NOTE — Progress Notes (Signed)
Patient discharged to home with husband in stable condition. Discharge instructions and papers given.

## 2017-07-29 NOTE — Progress Notes (Signed)
Patient ID: Jenny Ewing, female   DOB: 04-22-1955, 62 y.o.   MRN: [id number]  I have assumed care from Dr. Rosendo Gros. Chart/ imaging reviewed.   Plan urgent laparoscopic appendectomy this morning.  The surgical procedure has been discussed with the patient.  Potential risks, benefits, alternative treatments, and expected outcomes have been explained.  All of the patient's questions at this time have been answered.  The likelihood of reaching the patient's treatment goal is good.  The patient understand the proposed surgical procedure and wishes to proceed.   [name]. Georgette Dover, MD, [facility] Surgery  General/ Trauma Surgery  07/29/2017 8:27 AM

## 2017-07-29 NOTE — ED Provider Notes (Signed)
Patient with right lower quadrant abdominal pain that has been gradually worsening since this morning.  Patient has leukocytosis to 13.8.  CT scan shows acute tip appendicitis.  Patient discussed with Dr. Rosendo Gros general surgery, who will come to evaluate the patient.   Montine Circle, PA-C 07/29/17 [id number]    Ripley Fraise, MD 07/29/17 [phone]

## 2017-07-29 NOTE — Anesthesia Postprocedure Evaluation (Signed)
Anesthesia Post Note  Patient: Jenny Ewing  Procedure(s) Performed: APPENDECTOMY LAPAROSCOPIC (N/A Abdomen)     Patient location during evaluation: PACU Anesthesia Type: General Level of consciousness: awake and alert Pain management: pain level controlled Vital Signs Assessment: post-procedure vital signs reviewed and stable Respiratory status: spontaneous breathing, nonlabored ventilation, respiratory function stable and patient connected to nasal cannula oxygen Cardiovascular status: blood pressure returned to baseline and stable Postop Assessment: no apparent nausea or vomiting Anesthetic complications: no    Last Vitals:  Vitals:   07/29/17 1235 07/29/17 1255  BP:  (!) 100/55  Pulse: 93 94  Resp: 14 16  Temp:  37.3 C  SpO2: 100% 94%    Last Pain:  Vitals:   07/29/17 1255  TempSrc: Oral  PainSc:                  Hind Chesler DAVID

## 2017-07-29 NOTE — Anesthesia Preprocedure Evaluation (Signed)
Anesthesia Evaluation  Patient identified by MRN, date of birth, ID band Patient awake    Reviewed: Allergy & Precautions, NPO status , Patient's Chart, lab work & pertinent test results  Airway Mallampati: I  TM Distance: >3 FB Neck ROM: Full    Dental   Pulmonary    Pulmonary exam normal        Cardiovascular Normal cardiovascular exam     Neuro/Psych Depression    GI/Hepatic   Endo/Other    Renal/GU      Musculoskeletal   Abdominal   Peds  Hematology   Anesthesia Other Findings   Reproductive/Obstetrics                             Anesthesia Physical Anesthesia Plan  ASA: II  Anesthesia Plan: General   Post-op Pain Management:    Induction: Intravenous, Rapid sequence and Cricoid pressure planned  PONV Risk Score and Plan: 3 and Midazolam, Ondansetron and Treatment may vary due to age or medical condition  Airway Management Planned: Oral ETT  Additional Equipment:   Intra-op Plan:   Post-operative Plan: Extubation in OR  Informed Consent: I have reviewed the patients History and Physical, chart, labs and discussed the procedure including the risks, benefits and alternatives for the proposed anesthesia with the patient or authorized representative who has indicated his/her understanding and acceptance.     Plan Discussed with: CRNA and Surgeon  Anesthesia Plan Comments:         Anesthesia Quick Evaluation

## 2017-07-29 NOTE — Anesthesia Procedure Notes (Signed)
Procedure Name: Intubation Date/Time: 07/29/2017 10:13 AM Performed by: Glynda Jaeger, CRNA Pre-anesthesia Checklist: Patient identified, Patient being monitored, Timeout performed, Emergency Drugs available and Suction available Patient Re-evaluated:Patient Re-evaluated prior to induction Oxygen Delivery Method: Circle System Utilized Preoxygenation: Pre-oxygenation with 100% oxygen Induction Type: IV induction Ventilation: Mask ventilation without difficulty Laryngoscope Size: Mac and 3 Grade View: Grade I Tube type: Oral Tube size: 7.5 mm Number of attempts: 1 Airway Equipment and Method: Stylet Placement Confirmation: ETT inserted through vocal cords under direct vision,  positive ETCO2 and breath sounds checked- equal and bilateral Secured at: 19 cm Tube secured with: Tape Dental Injury: Teeth and Oropharynx as per pre-operative assessment

## 2017-07-29 NOTE — H&P (Signed)
Jenny Ewing is an 62 y.o. female.   Chief Complaint: abd pain HPI: Pt is a 62 y/o F with h/o depression with abd pain that began at approx 0800.  She states that it continuted throughout the day.  She denies and n/v/d/f/c while at home.    Pt presented to ED secondary to con't pain.  She underwent CT and labs studies.  CT was c/w acute tip appendicitis and elev WBC 13.8.  Gen Surg was consulted for further eval and mgmt.  Past Medical History:  Diagnosis Date  . DEPRESSION 01/27/2009    No past surgical history on file.  Family History  Problem Relation Age of Onset  . Cancer Other        colon  . Hypertension Other   . Stroke Other   . Hypertension Mother   . Hypertension Father    Social History:  reports that she has never smoked. She has never used smokeless tobacco. Her alcohol and drug histories are not on file.  Allergies:  Allergies  Allergen Reactions  . Penicillins     REACTION: Hives     (Not in a hospital admission)  Results for orders placed or performed during the hospital encounter of 07/28/17 (from the past 48 hour(s))  Urinalysis, Routine w reflex microscopic     Status: Abnormal   Collection Time: 07/28/17  4:28 PM  Result Value Ref Range   Color, Urine YELLOW YELLOW   APPearance HAZY (A) CLEAR   Specific Gravity, Urine 1.025 1.005 - 1.030   pH 5.0 5.0 - 8.0   Glucose, UA NEGATIVE NEGATIVE mg/dL   Hgb urine dipstick SMALL (A) NEGATIVE   Bilirubin Urine NEGATIVE NEGATIVE   Ketones, ur 80 (A) NEGATIVE mg/dL   Protein, ur 30 (A) NEGATIVE mg/dL   Nitrite NEGATIVE NEGATIVE   Leukocytes, UA SMALL (A) NEGATIVE   RBC / HPF 0-5 0 - 5 RBC/hpf   WBC, UA 11-20 0 - 5 WBC/hpf   Bacteria, UA NONE SEEN NONE SEEN   Squamous Epithelial / LPF 0-5 0 - 5    Comment: Please note change in reference range.   Mucus PRESENT     Comment: Performed at [facility], [place] [address] [place], [place] [address]  Lipase, blood     Status: None   Collection Time:  07/28/17  4:37 PM  Result Value Ref Range   Lipase 18 11 - 51 U/L    Comment: Performed at [facility] [address] [place], [place] [address]  Comprehensive metabolic panel     Status: Abnormal   Collection Time: 07/28/17  4:37 PM  Result Value Ref Range   Sodium 140 135 - 145 mmol/L   Potassium 4.1 3.5 - 5.1 mmol/L   Chloride 102 101 - 111 mmol/L   CO2 19 (L) 22 - 32 mmol/L   Glucose, Bld 87 65 - 99 mg/dL   BUN 11 6 - 20 mg/dL   Creatinine, Ser 0.85 0.44 - 1.00 mg/dL   Calcium 8.9 8.9 - 10.3 mg/dL   Total Protein 6.8 6.5 - 8.1 g/dL   Albumin 4.1 3.5 - 5.0 g/dL   AST 20 15 - 41 U/L   ALT 17 14 - 54 U/L   Alkaline Phosphatase 64 38 - 126 U/L   Total Bilirubin 1.2 0.3 - 1.2 mg/dL   GFR calc non Af Amer >60 >60 mL/min   GFR calc Af Amer >60 >60 mL/min  Comment: (NOTE) The eGFR has been calculated using the CKD EPI equation. This calculation has not been validated in all clinical situations. eGFR's persistently <60 mL/min signify possible Chronic Kidney Disease.    Anion gap 19 (H) 5 - 15    Comment: Performed at [facility], [place] [address] [place], [place] [address]  CBC     Status: Abnormal   Collection Time: 07/28/17  4:37 PM  Result Value Ref Range   WBC 13.8 (H) 4.0 - 10.5 K/uL   RBC 4.36 3.87 - 5.11 MIL/uL   Hemoglobin 13.5 12.0 - 15.0 g/dL   HCT 42.3 36.0 - 46.0 %   MCV 97.0 78.0 - 100.0 fL   MCH 31.0 26.0 - 34.0 pg   MCHC 31.9 30.0 - 36.0 g/dL   RDW 13.5 11.5 - 15.5 %   Platelets 272 150 - 400 K/uL    Comment: Performed at [facility] [address], [place], [place] [address]   Ct Abdomen Pelvis W Contrast  Result Date: 07/29/2017 CLINICAL DATA:  Initial evaluation for acute lower abdominal pain. EXAM: CT ABDOMEN AND PELVIS WITH CONTRAST TECHNIQUE: Multidetector CT imaging of the abdomen and pelvis was performed using the standard protocol following bolus administration of intravenous contrast. CONTRAST:  191m ISOVUE-300 IOPAMIDOL  (ISOVUE-300) INJECTION 61% COMPARISON:  None. FINDINGS: Lower chest: Mild scattered subsegmental atelectatic changes present within the lung bases. 5 mm nodular density present at the peripheral left lower lobe (series 5, image 3), indeterminate. Visualized lungs are otherwise clear. Hepatobiliary: Subcentimeter hypodensity within the peripheral right hepatic lobe noted, too small the characterize, but of doubtful significance. Focal fat deposition noted adjacent to the falciform ligament. Liver otherwise a thin normal limits. Gallbladder normal. No biliary dilatation. Pancreas: Pancreas within normal limits. Spleen: Spleen within normal limits. Adrenals/Urinary Tract: Adrenal glands are normal. Kidneys equal in size with symmetric enhancement. Few scattered subcentimeter hypodensities noted, too small the characterize, but statistically likely reflects small cyst. No nephrolithiasis, hydronephrosis, or focal enhancing renal mass. No hydroureter. Partially distended bladder within normal limits. Stomach/Bowel: Stomach within normal limits. No evidence for bowel obstruction. Sigmoid diverticulosis without evidence for acute diverticulitis. Appendix: Location: Right lower quadrant, extending inferiorly from the cecum. Diameter: Up to 13 mm at the appendiceal tip. Appendicolith: Present at the mid aspect of the appendix (series 6, image 37). Mucosal hyper-enhancement: Present with adjacent periappendiceal fat stranding about the appendiceal tip. Extraluminal gas: Negative. Periappendiceal collection: Periappendiceal fat stranding without discrete collection. Vascular/Lymphatic: Normal intravascular enhancement seen throughout the intra-abdominal aorta and its branch vessels. No pathologically enlarged intra-abdominal or pelvic lymph nodes. Reproductive: Uterus and ovaries within normal limits. Other: No free air or fluid. Tiny fat containing paraumbilical hernia noted without associated inflammation. Musculoskeletal:  No acute osseous abnormality. No worrisome lytic or blastic osseous lesions. Bilateral facet arthropathy noted at L4-5 and L5-S1. IMPRESSION: 1. Findings consistent with acute tip appendicitis. No complication identified. 2. No other acute abnormality within the abdomen and pelvis. 3. Sigmoid diverticulosis without evidence for acute diverticulitis. Electronically Signed   By: BJeannine BogaM.D.   On: 07/29/2017 02:12    Review of Systems  Constitutional: Negative for chills, fever and malaise/fatigue.  HENT: Negative for ear discharge, hearing loss and sore throat.   Eyes: Negative for blurred vision and discharge.  Respiratory: Negative for cough and shortness of breath.   Cardiovascular: Negative for chest pain, orthopnea and leg swelling.  Gastrointestinal: Positive for abdominal pain. Negative for constipation, diarrhea, heartburn,  nausea and vomiting.  Musculoskeletal: Negative for myalgias and neck pain.  Skin: Negative for itching and rash.  Neurological: Negative for dizziness, focal weakness, seizures and loss of consciousness.  Endo/Heme/Allergies: Negative for environmental allergies. Does not bruise/bleed easily.  Psychiatric/Behavioral: Negative for depression and suicidal ideas.  All other systems reviewed and are negative.   Blood pressure 118/69, pulse 79, temperature 98.3 F (36.8 C), temperature source Oral, resp. rate 18, SpO2 99 %. Physical Exam  Constitutional: She is oriented to person, place, and time. Vital signs are normal. She appears well-developed and well-nourished.  Conversant No acute distress  Eyes: Pupils are equal, round, and reactive to light. Conjunctivae, EOM and lids are normal. No scleral icterus.  No lid lag Moist conjunctiva  Neck: Normal range of motion. Neck supple. No tracheal tenderness present. No thyromegaly present.  No cervical lymphadenopathy  Cardiovascular: Normal rate, regular rhythm and intact distal pulses.  No murmur  heard. Respiratory: Effort normal and breath sounds normal. She has no wheezes. She has no rales.  GI: Soft. Bowel sounds are normal. There is no hepatosplenomegaly. There is tenderness. There is guarding and tenderness at McBurney's point. There is no rebound. No hernia.  Neurological: She is alert and oriented to person, place, and time.  Normal gait and station  Skin: Skin is warm. No rash noted. No cyanosis. Nails show no clubbing.  Normal skin turgor  Psychiatric: Judgment normal.  Appropriate affect     Assessment/Plan 62 y/o F acute appendicitis Depression  1. Will admit to hospital for removal of appendix by Dr. Georgette Dover.  Will d/w him in the AM.  IVF. IV abx  2. I discussed with the patient the risks benefits of the procedure to include but not limited to: Infection, bleeding, damage to surrounding structures, possible ileus, possible postoperative infection. Patient voiced understanding and wishes to proceed.   Reyes Ivan, MD 07/29/2017, 2:51 AM

## 2017-07-29 NOTE — Op Note (Signed)
Appendectomy, Lap, Procedure Note  Indications: The patient presented with a history of right-sided abdominal pain. A CT scan revealed findings consistent with acute appendicitis.  Pre-operative Diagnosis: Acute appendicitis without mention of peritonitis  Post-operative Diagnosis: Same  Surgeon: Maia Petties   Assistants: none  Anesthesia: General endotracheal anesthesia  ASA Class: 1E  Procedure Details  The patient was seen again in the Holding Room. The risks, benefits, complications, treatment options, and expected outcomes were discussed with the patient and/or family. The possibilities of reaction to medication, perforation of viscus, bleeding, recurrent infection, finding a normal appendix, the need for additional procedures, failure to diagnose a condition, and creating a complication requiring transfusion or operation were discussed. There was concurrence with the proposed plan and informed consent was obtained. The site of surgery was properly noted. The patient was taken to Operating Room, identified as EDELYN HEIDEL and the procedure verified as Appendectomy. A Time Out was held and the above information confirmed.  The patient was placed in the supine position and general anesthesia was induced.  The abdomen was prepped and draped in a sterile fashion. A one centimeter infraumbilical incision was made.  Dissection was carried down to the fascia bluntly.  The fascia was incised vertically.  We entered the peritoneal cavity bluntly.  A pursestring suture was passed around the incision with a 0 Vicryl.  The Hasson cannula was introduced into the abdomen and the tails of the suture were used to hold the Hasson in place.   The pneumoperitoneum was then established maintaining a maximum pressure of 15 mmHg.  Additional 5 mm cannulas then placed in the left lower quadrant of the abdomen and the right upper quadrant under direct visualization. A careful evaluation of the entire abdomen  was carried out. The patient was placed in Trendelenburg and left lateral decubitus position.  The scope was moved to the right upper quadrant port site. The cecum was mobilized medially.  The appendix was identified.  It was inflamed and edematous at its tip, but there was no sign of perforation. The appendix was carefully dissected. The appendix was was skeletonized with the harmonic scalpel.   The appendix was divided at its base using an endo-GIA stapler. Minimal appendiceal stump was left in place. There was no evidence of bleeding, leakage, or complication after division of the appendix. Irrigation was also performed and irrigate suctioned from the abdomen as well.  The umbilical port site was closed with the purse string suture. There was no residual palpable fascial defect.  The trocar site skin wounds were closed with 4-0 Monocryl.  Instrument, sponge, and needle counts were correct at the conclusion of the case.   Findings: The appendix was found to be inflamed. There were not signs of necrosis.  There was not perforation. There was not abscess formation.  Estimated Blood Loss:  Minimal         Drains: none         Specimens: appendix         Complications:  None; patient tolerated the procedure well.         Disposition: PACU - hemodynamically stable.         Condition: stable  [name]. Georgette Dover, MD, [facility] Surgery  General/ Trauma Surgery  07/29/2017 10:53 AM

## 2017-07-29 NOTE — Discharge Instructions (Signed)

## 2017-07-29 NOTE — Discharge Summary (Signed)
[facility] Discharge Summary   Patient ID: Jenny Ewing MRN: [id number] DOB/AGE: October 22, 1955 62 y.o.  Admit date: 07/28/2017 Discharge date: 07/29/2017  Admitting Diagnosis: appendicitis  Discharge Diagnosis Patient Active Problem List   Diagnosis Date Noted  . Acute appendicitis 07/29/2017  . Pharyngitis 08/26/2010  . History of depression 01/27/2009    Consultants none  Imaging: Ct Abdomen Pelvis W Contrast  Result Date: 07/29/2017 CLINICAL DATA:  Initial evaluation for acute lower abdominal pain. EXAM: CT ABDOMEN AND PELVIS WITH CONTRAST TECHNIQUE: Multidetector CT imaging of the abdomen and pelvis was performed using the standard protocol following bolus administration of intravenous contrast. CONTRAST:  167mL ISOVUE-300 IOPAMIDOL (ISOVUE-300) INJECTION 61% COMPARISON:  None. FINDINGS: Lower chest: Mild scattered subsegmental atelectatic changes present within the lung bases. 5 mm nodular density present at the peripheral left lower lobe (series 5, image 3), indeterminate. Visualized lungs are otherwise clear. Hepatobiliary: Subcentimeter hypodensity within the peripheral right hepatic lobe noted, too small the characterize, but of doubtful significance. Focal fat deposition noted adjacent to the falciform ligament. Liver otherwise a thin normal limits. Gallbladder normal. No biliary dilatation. Pancreas: Pancreas within normal limits. Spleen: Spleen within normal limits. Adrenals/Urinary Tract: Adrenal glands are normal. Kidneys equal in size with symmetric enhancement. Few scattered subcentimeter hypodensities noted, too small the characterize, but statistically likely reflects small cyst. No nephrolithiasis, hydronephrosis, or focal enhancing renal mass. No hydroureter. Partially distended bladder within normal limits. Stomach/Bowel: Stomach within normal limits. No evidence for bowel obstruction. Sigmoid diverticulosis without evidence for acute diverticulitis.  Appendix: Location: Right lower quadrant, extending inferiorly from the cecum. Diameter: Up to 13 mm at the appendiceal tip. Appendicolith: Present at the mid aspect of the appendix (series 6, image 37). Mucosal hyper-enhancement: Present with adjacent periappendiceal fat stranding about the appendiceal tip. Extraluminal gas: Negative. Periappendiceal collection: Periappendiceal fat stranding without discrete collection. Vascular/Lymphatic: Normal intravascular enhancement seen throughout the intra-abdominal aorta and its branch vessels. No pathologically enlarged intra-abdominal or pelvic lymph nodes. Reproductive: Uterus and ovaries within normal limits. Other: No free air or fluid. Tiny fat containing paraumbilical hernia noted without associated inflammation. Musculoskeletal: No acute osseous abnormality. No worrisome lytic or blastic osseous lesions. Bilateral facet arthropathy noted at L4-5 and L5-S1. IMPRESSION: 1. Findings consistent with acute tip appendicitis. No complication identified. 2. No other acute abnormality within the abdomen and pelvis. 3. Sigmoid diverticulosis without evidence for acute diverticulitis. Electronically Signed   By: Jeannine Boga M.D.   On: 07/29/2017 02:12    Procedures Dr. Georgette Dover (07/29/17) - Laparoscopic Appendectomy  Hospital Course:  Pt presented to [facility] with abdominal pain.  Workup showed appendicitis.  Patient was admitted and underwent procedure listed above.  Tolerated procedure well and was transferred to the floor.  Diet was advanced as tolerated.  On POD#0, the patient was voiding well, tolerating diet, ambulating well, pain well controlled, vital signs stable, incisions c/d/i and felt stable for discharge home.  Patient will follow up in our office in 2 weeks and knows to call with questions or concerns.  She will call to confirm appointment date/time.    Patient was discharged in good condition.  The [place] Substance controlled database was  reviewed prior to prescribing narcotic pain medication to this patient.  Physical Exam: General:  Alert, NAD, pleasant, cooperative Cardio: RRR, S1 & S2 normal, no murmur, rubs, gallops Resp: Effort normal, lungs CTA bilaterally, no wheezes, rales, rhonchi Abd:  Soft, ND, normal bowel sounds, no tenderness, incisions C/D/I Skin: warm and  dry, no rashes noted  Allergies as of 07/29/2017      Reactions   Penicillins    REACTION: Hives      Medication List    TAKE these medications   citalopram 40 MG tablet Commonly known as:  CELEXA Take 1 tablet (40 mg total) by mouth daily.   oxyCODONE 5 MG immediate release tablet Commonly known as:  Oxy IR/ROXICODONE Take 1 tablet (5 mg total) by mouth every 4 (four) hours as needed for moderate pain.        Follow-up [facility] Follow up.   Specialty:  General Surgery Why:  please call our office for your follow up appointment date and time. Please arrive 30 min prior to appointment time. Bring photo ID and insurance information.  Contact information: [address] [place] [phone]          Signed: Fraser Din [facility] 07/29/2017, 4:16 PM Pager: [phone] Consults: [phone] Mon-Fri 7:00 am-4:30 pm Sat-Sun 7:00 am-11:30 am

## 2017-07-29 NOTE — ED Notes (Signed)
Pt undressed and in gown 

## 2017-07-29 NOTE — Transfer of Care (Signed)
Immediate Anesthesia Transfer of Care Note  Patient: Jenny Ewing  Procedure(s) Performed: APPENDECTOMY LAPAROSCOPIC (N/A Abdomen)  Patient Location: PACU  Anesthesia Type:General  Level of Consciousness: awake, alert , patient cooperative and responds to stimulation  Airway & Oxygen Therapy: Patient Spontanous Breathing and Patient connected to nasal cannula oxygen  Post-op Assessment: Report given to RN, Post -op Vital signs reviewed and stable and Patient moving all extremities X 4  Post vital signs: Reviewed and stable  Last Vitals:  Vitals Value Taken Time  BP 121/67 07/29/2017 10:58 AM  Temp    Pulse 103 07/29/2017 10:59 AM  Resp 20 07/29/2017 10:59 AM  SpO2 93 % 07/29/2017 10:59 AM  Vitals shown include unvalidated device data.  Last Pain:  Vitals:   07/29/17 0648  TempSrc:   PainSc: Asleep         Complications: No apparent anesthesia complications

## 2017-07-30 ENCOUNTER — Encounter (HOSPITAL_COMMUNITY): Payer: Self-pay | Admitting: Surgery

## 2018-06-19 ENCOUNTER — Other Ambulatory Visit: Payer: Self-pay | Admitting: Family Medicine

## 2018-09-12 ENCOUNTER — Ambulatory Visit (INDEPENDENT_AMBULATORY_CARE_PROVIDER_SITE_OTHER): Payer: PRIVATE HEALTH INSURANCE | Admitting: Family Medicine

## 2018-09-12 ENCOUNTER — Other Ambulatory Visit: Payer: Self-pay

## 2018-09-12 DIAGNOSIS — Z8659 Personal history of other mental and behavioral disorders: Secondary | ICD-10-CM

## 2018-09-12 MED ORDER — CITALOPRAM HYDROBROMIDE 40 MG PO TABS: 40.0000 mg | ORAL_TABLET | Freq: Every day | ORAL | 3 refills | Status: DC

## 2018-09-12 NOTE — Progress Notes (Signed)
Patient ID: Jenny Ewing, female   DOB: 1956/01/12, 63 y.o.   MRN: [id number]  This visit type was conducted due to national recommendations for restrictions regarding the COVID-19 pandemic in an effort to limit this patient's exposure and mitigate transmission in our community.   Virtual Visit via Video Note  I connected with Berenis Corter on 09/12/18 at  8:30 AM EDT by a video enabled telemedicine application and verified that I am speaking with the correct person using two identifiers.  Location patient: home Location provider:work or home office Persons participating in the virtual visit: patient, provider  I discussed the limitations of evaluation and management by telemedicine and the availability of in person appointments. The patient expressed understanding and agreed to proceed.   HPI:  Patient has long history of recurrent depression.  She has been on Celexa 40 mg daily for many years and is very reluctant to discontinue.  She is doing well overall.  Denies any depressed mood.  She had appendectomy since we saw her last.  She also went on the keto diet with some intermittent fasting along with her husband and she states her current weight is down to 136 pounds and she is very pleased with that.  She is just trying to maintain at this point.  She apparently does not have insurance coverage for physicals and declines at this time.   ROS: See pertinent positives and negatives per HPI.  Past Medical History:  Diagnosis Date  . DEPRESSION 01/27/2009    Past Surgical History:  Procedure Laterality Date  . APPENDECTOMY  07/29/2017   LAPROSCOPIC  . LAPAROSCOPIC APPENDECTOMY N/A 07/29/2017   Procedure: APPENDECTOMY LAPAROSCOPIC;  Surgeon: Donnie Mesa, MD;  Location: [place];  Service: General;  Laterality: N/A;  . TUBAL LIGATION      Family History  Problem Relation Age of Onset  . Cancer Other        colon  . Hypertension Other   . Stroke Other   . Hypertension Mother   .  Hypertension Father     SOCIAL HX: Non-smoker   Current Outpatient Medications:  .  citalopram (CELEXA) 40 MG tablet, Take 1 tablet (40 mg total) by mouth daily., Disp: 90 tablet, Rfl: 3 .  oxyCODONE (OXY IR/ROXICODONE) 5 MG immediate release tablet, Take 1 tablet (5 mg total) by mouth every 4 (four) hours as needed for moderate pain., Disp: 15 tablet, Rfl: 0  EXAM:  VITALS per patient if applicable:  GENERAL: alert, oriented, appears well and in no acute distress  HEENT: atraumatic, conjunttiva clear, no obvious abnormalities on inspection of external nose and ears  NECK: normal movements of the head and neck  LUNGS: on inspection no signs of respiratory distress, breathing rate appears normal, no obvious gross SOB, gasping or wheezing  CV: no obvious cyanosis  MS: moves all visible extremities without noticeable abnormality  PSYCH/NEURO: pleasant and cooperative, no obvious depression or anxiety, speech and thought processing grossly intact  ASSESSMENT AND PLAN:  Discussed the following assessment and plan:  History of recurrent depression stable on Celexa  -Refill Celexa for 1 year -Needs health maintenance including colonoscopy which she declines at this time     I discussed the assessment and treatment plan with the patient. The patient was provided an opportunity to ask questions and all were answered. The patient agreed with the plan and demonstrated an understanding of the instructions.   The patient was advised to call back or seek an in-person evaluation if  the symptoms worsen or if the condition fails to improve as anticipated.   Carolann Littler, MD

## 2018-09-15 ENCOUNTER — Ambulatory Visit: Payer: PRIVATE HEALTH INSURANCE | Admitting: Family Medicine

## 2018-12-11 ENCOUNTER — Other Ambulatory Visit: Payer: Self-pay

## 2018-12-11 DIAGNOSIS — Z20822 Contact with and (suspected) exposure to covid-19: Secondary | ICD-10-CM

## 2018-12-12 LAB — NOVEL CORONAVIRUS, NAA: SARS-CoV-2, NAA: DETECTED — AB

## 2019-04-29 ENCOUNTER — Telehealth (INDEPENDENT_AMBULATORY_CARE_PROVIDER_SITE_OTHER): Payer: PRIVATE HEALTH INSURANCE | Admitting: Family Medicine

## 2019-04-29 ENCOUNTER — Other Ambulatory Visit: Payer: Self-pay

## 2019-04-29 DIAGNOSIS — F419 Anxiety disorder, unspecified: Secondary | ICD-10-CM

## 2019-04-29 MED ORDER — LORAZEPAM 0.5 MG PO TABS: 0.5000 mg | ORAL_TABLET | Freq: Three times a day (TID) | ORAL | 0 refills | Status: DC | PRN

## 2019-04-29 NOTE — Progress Notes (Signed)
This visit type was conducted due to national recommendations for restrictions regarding the COVID-19 pandemic in an effort to limit this patient's exposure and mitigate transmission in our community.   Virtual Visit via Telephone Note  I connected with Jenny Ewing on 04/29/19 at  2:45 PM EST by telephone and verified that I am speaking with the correct person using two identifiers.   I discussed the limitations, risks, security and privacy concerns of performing an evaluation and management service by telephone and the availability of in person appointments. I also discussed with the patient that there may be a patient responsible charge related to this service. The patient expressed understanding and agreed to proceed.  Location patient: home Location provider: work or home office Participants present for the call: patient, provider Patient did not have a visit in the prior 7 days to address this/these issue(s).   History of Present Illness:  Jenny Ewing had called about anxiety symptoms.  She has history of recurrent depression and takes Celexa 40 mg daily.  She has recently had stress of stepdaughter is currently living with her and her husband along with her 100-month-old baby.  Stepdaughter has had some behavioral issues and reported borderline personality disorder.  Jenny Ewing had episode recently where she had extreme anxiety symptoms.  She states this felt like a panic attack with heart pounding and racing and dyspnea.  She is very concerned about having similar future episodes.  She takes her Celexa 40 mg daily regularly.  She had basically called requesting something else to add to her Celexa for severe anxiety symptoms.  No regular alcohol use.  No illicit drug use history.  Non-smoker.   Observations/Objective: Patient sounds cheerful and well on the phone. I do not appreciate any SOB. Speech and thought processing are grossly intact. Patient reported vitals:  Assessment and  Plan:  Severe anxiety.  Some of this sounds to be situational but possible panic attack.  -Continue Celexa 40 mg daily -We discussed other possible antianxiety medications but would avoid medication such as BuSpar because of risk of serotonin syndrome in combination with SSRI -Agreed to very limited lorazepam 0.5 mg 1 every 8 hours only to take for severe anxiety symptoms #20 with no refill -We discussed possible counseling but she wishes to wait at this time -She has physical in February and touch base for update at that point  Follow Up Instructions:  -As above   99441 5-10 [id number] 11-20 99443 21-30 I did not refer this patient for an OV in the next 24 hours for this/these issue(s).  I discussed the assessment and treatment plan with the patient. The patient was provided an opportunity to ask questions and all were answered. The patient agreed with the plan and demonstrated an understanding of the instructions.   The patient was advised to call back or seek an in-person evaluation if the symptoms worsen or if the condition fails to improve as anticipated.  I provided 18 minutes of non-face-to-face time during this encounter.   Carolann Littler, MD

## 2019-05-06 ENCOUNTER — Encounter: Payer: Self-pay | Admitting: Family Medicine

## 2019-05-06 ENCOUNTER — Ambulatory Visit (INDEPENDENT_AMBULATORY_CARE_PROVIDER_SITE_OTHER): Payer: Medicare Other | Admitting: Family Medicine

## 2019-05-06 ENCOUNTER — Other Ambulatory Visit: Payer: Self-pay

## 2019-05-06 VITALS — BP 110/80 | HR 85 | Temp 97.3°F | Ht 63.5 in | Wt 136.0 lb

## 2019-05-06 DIAGNOSIS — Z13820 Encounter for screening for osteoporosis: Secondary | ICD-10-CM

## 2019-05-06 DIAGNOSIS — Z23 Encounter for immunization: Secondary | ICD-10-CM

## 2019-05-06 DIAGNOSIS — Z1231 Encounter for screening mammogram for malignant neoplasm of breast: Secondary | ICD-10-CM

## 2019-05-06 DIAGNOSIS — Z780 Asymptomatic menopausal state: Secondary | ICD-10-CM

## 2019-05-06 DIAGNOSIS — Z0000 Encounter for general adult medical examination without abnormal findings: Secondary | ICD-10-CM | POA: Diagnosis not present

## 2019-05-06 DIAGNOSIS — Z1211 Encounter for screening for malignant neoplasm of colon: Secondary | ICD-10-CM

## 2019-05-06 LAB — LIPID PANEL
Cholesterol: 220 mg/dL — ABNORMAL HIGH (ref 0–200)
HDL: 93.8 mg/dL (ref >39.00)
LDL Cholesterol: 115 mg/dL — ABNORMAL HIGH (ref 0–99)
NonHDL: 126.2
Total CHOL/HDL Ratio: 2
Triglycerides: 58 mg/dL (ref 0.0–149.0)
VLDL: 11.6 mg/dL (ref 0.0–40.0)

## 2019-05-06 LAB — CBC WITH DIFFERENTIAL/PLATELET
Basophils Absolute: 0 10*3/uL (ref 0.0–0.1)
Basophils Relative: 0.7 % (ref 0.0–3.0)
Eosinophils Absolute: 0.1 10*3/uL (ref 0.0–0.7)
Eosinophils Relative: 2.4 % (ref 0.0–5.0)
HCT: 42.8 % (ref 36.0–46.0)
Hemoglobin: 14 g/dL (ref 12.0–15.0)
Lymphocytes Relative: 28.1 % (ref 12.0–46.0)
Lymphs Abs: 1.5 10*3/uL (ref 0.7–4.0)
MCHC: 32.7 g/dL (ref 30.0–36.0)
MCV: 96 fl (ref 78.0–100.0)
Monocytes Absolute: 0.5 10*3/uL (ref 0.1–1.0)
Monocytes Relative: 9.5 % (ref 3.0–12.0)
Neutro Abs: 3.1 10*3/uL (ref 1.4–7.7)
Neutrophils Relative %: 59.3 % (ref 43.0–77.0)
Platelets: 252 10*3/uL (ref 150.0–400.0)
RBC: 4.46 Mil/uL (ref 3.87–5.11)
RDW: 13 % (ref 11.5–15.5)
WBC: 5.2 10*3/uL (ref 4.0–10.5)

## 2019-05-06 LAB — TSH: TSH: 3.06 u[IU]/mL (ref 0.35–4.50)

## 2019-05-06 LAB — HEPATIC FUNCTION PANEL
ALT: 10 U/L (ref 0–35)
AST: 16 U/L (ref 0–37)
Albumin: 4.4 g/dL (ref 3.5–5.2)
Alkaline Phosphatase: 63 U/L (ref 39–117)
Bilirubin, Direct: 0.1 mg/dL (ref 0.0–0.3)
Total Bilirubin: 0.4 mg/dL (ref 0.2–1.2)
Total Protein: 6.4 g/dL (ref 6.0–8.3)

## 2019-05-06 LAB — BASIC METABOLIC PANEL
BUN: 22 mg/dL (ref 6–23)
CO2: 31 mEq/L (ref 19–32)
Calcium: 9.4 mg/dL (ref 8.4–10.5)
Chloride: 102 mEq/L (ref 96–112)
Creatinine, Ser: 0.86 mg/dL (ref 0.40–1.20)
GFR: 66.16 mL/min (ref >60.00)
Glucose, Bld: 92 mg/dL (ref 70–99)
Potassium: 4.9 mEq/L (ref 3.5–5.1)
Sodium: 139 mEq/L (ref 135–145)

## 2019-05-06 NOTE — Patient Instructions (Signed)
Preventive Care 66 Years and Older, Female Preventive care refers to lifestyle choices and visits with your health care provider that can promote health and wellness. This includes:  A yearly physical exam. This is also called an annual well check.  Regular dental and eye exams.  Immunizations.  Screening for certain conditions.  Healthy lifestyle choices, such as diet and exercise. What can I expect for my preventive care visit? Physical exam Your health care provider will check:  Height and weight. These may be used to calculate body mass index (BMI), which is a measurement that tells if you are at a healthy weight.  Heart rate and blood pressure.  Your skin for abnormal spots. Counseling Your health care provider may ask you questions about:  Alcohol, tobacco, and drug use.  Emotional well-being.  Home and relationship well-being.  Sexual activity.  Eating habits.  History of falls.  Memory and ability to understand (cognition).  Work and work Statistician.  Pregnancy and menstrual history. What immunizations do I need?  Influenza (flu) vaccine  This is recommended every year. Tetanus, diphtheria, and pertussis (Tdap) vaccine  You may need a Td booster every 10 years. Varicella (chickenpox) vaccine  You may need this vaccine if you have not already been vaccinated. Zoster (shingles) vaccine  You may need this after age 33. Pneumococcal conjugate (PCV13) vaccine  One dose is recommended after age 33. Pneumococcal polysaccharide (PPSV23) vaccine  One dose is recommended after age 72. Measles, mumps, and rubella (MMR) vaccine  You may need at least one dose of MMR if you were born in 1957 or later. You may also need a second dose. Meningococcal conjugate (MenACWY) vaccine  You may need this if you have certain conditions. Hepatitis A vaccine  You may need this if you have certain conditions or if you travel or work in places where you may be exposed  to hepatitis A. Hepatitis B vaccine  You may need this if you have certain conditions or if you travel or work in places where you may be exposed to hepatitis B. Haemophilus influenzae type b (Hib) vaccine  You may need this if you have certain conditions. You may receive vaccines as individual doses or as more than one vaccine together in one shot (combination vaccines). Talk with your health care provider about the risks and benefits of combination vaccines. What tests do I need? Blood tests  Lipid and cholesterol levels. These may be checked every 5 years, or more frequently depending on your overall health.  Hepatitis C test.  Hepatitis B test. Screening  Lung cancer screening. You may have this screening every year starting at age 39 if you have a 30-pack-year history of smoking and currently smoke or have quit within the past 15 years.  Colorectal cancer screening. All adults should have this screening starting at age 36 and continuing until age 15. Your health care provider may recommend screening at age 23 if you are at increased risk. You will have tests every 1-10 years, depending on your results and the type of screening test.  Diabetes screening. This is done by checking your blood sugar (glucose) after you have not eaten for a while (fasting). You may have this done every 1-3 years.  Mammogram. This may be done every 1-2 years. Talk with your health care provider about how often you should have regular mammograms.  BRCA-related cancer screening. This may be done if you have a family history of breast, ovarian, tubal, or peritoneal cancers.  Other tests  Sexually transmitted disease (STD) testing.  Bone density scan. This is done to screen for osteoporosis. You may have this done starting at age 44. Follow these instructions at home: Eating and drinking  Eat a diet that includes fresh fruits and vegetables, whole grains, lean protein, and low-fat dairy products. Limit  your intake of foods with high amounts of sugar, saturated fats, and salt.  Take vitamin and mineral supplements as recommended by your health care provider.  Do not drink alcohol if your health care provider tells you not to drink.  If you drink alcohol: ? Limit how much you have to 0-1 drink a day. ? Be aware of how much alcohol is in your drink. In the [place] one drink equals one 12 oz bottle of beer (355 mL), one 5 oz glass of wine (148 mL), or one 1 oz glass of hard liquor (44 mL). Lifestyle  Take daily care of your teeth and gums.  Stay active. Exercise for at least 30 minutes on 5 or more days each week.  Do not use any products that contain nicotine or tobacco, such as cigarettes, e-cigarettes, and chewing tobacco. If you need help quitting, ask your health care provider.  If you are sexually active, practice safe sex. Use a condom or other form of protection in order to prevent STIs (sexually transmitted infections).  Talk with your health care provider about taking a low-dose aspirin or statin. What's next?  Go to your health care provider once a year for a well check visit.  Ask your health care provider how often you should have your eyes and teeth checked.  Stay up to date on all vaccines. This information is not intended to replace advice given to you by your health care provider. Make sure you discuss any questions you have with your health care provider. Document Revised: 03/13/2018 Document Reviewed: 03/13/2018 Elsevier Patient Education  2020 [employer].

## 2019-05-06 NOTE — Progress Notes (Signed)
Subjective:     Patient ID: Jenny Ewing, female   DOB: 1955-09-16, 64 y.o.   MRN: [id number]  HPI   Jenny Ewing is here for physical exam. She just recently got insurance. She just turned 64. She has lost about 45 pounds over the past several months mostly by keto diet.  She has not had several health maintenance issues addressed in several years because of her lack of insurance. She has not had colonoscopy in over 10 years. No recent mammograms. No flu vaccine. No history of Pneumovax. No Pap smear in several years. Denies previous DEXA scan.  Past Medical History:  Diagnosis Date  . DEPRESSION 01/27/2009   Past Surgical History:  Procedure Laterality Date  . APPENDECTOMY  07/29/2017   LAPROSCOPIC  . LAPAROSCOPIC APPENDECTOMY N/A 07/29/2017   Procedure: APPENDECTOMY LAPAROSCOPIC;  Surgeon: Donnie Mesa, MD;  Location: [place];  Service: General;  Laterality: N/A;  . TUBAL LIGATION      reports that she has never smoked. She has never used smokeless tobacco. She reports current alcohol use. She reports that she does not use drugs. family history includes Cancer in an other family member; Hypertension in her father, mother, and another family member; Stroke in an other family member. Allergies  Allergen Reactions  . Penicillins     REACTION: Hives     Review of Systems  Constitutional: Negative for activity change, appetite change, chills, fatigue, fever and unexpected weight change.  HENT: Negative for ear pain, hearing loss, sore throat and trouble swallowing.   Eyes: Negative for visual disturbance.  Respiratory: Negative for cough and shortness of breath.   Cardiovascular: Negative for chest pain, palpitations and leg swelling.  Gastrointestinal: Negative for abdominal pain, blood in stool, constipation and diarrhea.  Genitourinary: Negative for dysuria and hematuria.  Musculoskeletal: Negative for arthralgias, back pain and myalgias.  Skin: Negative for rash.   Neurological: Negative for dizziness, syncope, weakness and headaches.  Hematological: Negative for adenopathy.  Psychiatric/Behavioral: Negative for confusion and dysphoric mood.       Objective:   Physical Exam Vitals reviewed.  Constitutional:      Appearance: She is well-developed.  HENT:     Head: Normocephalic and atraumatic.  Eyes:     Pupils: Pupils are equal, round, and reactive to light.  Neck:     Thyroid: No thyromegaly.  Cardiovascular:     Rate and Rhythm: Normal rate and regular rhythm.     Heart sounds: Normal heart sounds. No murmur.  Pulmonary:     Effort: No respiratory distress.     Breath sounds: Normal breath sounds. No wheezing or rales.     Comments: Breasts are symmetric with no mass noted.  No nipple inversion or skin dimpling. Abdominal:     General: Bowel sounds are normal. There is no distension.     Palpations: Abdomen is soft. There is no mass.     Tenderness: There is no abdominal tenderness. There is no guarding or rebound.  Musculoskeletal:        General: Normal range of motion.     Cervical back: Normal range of motion and neck supple.  Lymphadenopathy:     Cervical: No cervical adenopathy.  Skin:    Findings: No rash.  Neurological:     Mental Status: She is alert and oriented to person, place, and time.     Cranial Nerves: No cranial nerve deficit.     Deep Tendon Reflexes: Reflexes normal.  Psychiatric:  Behavior: Behavior normal.        Thought Content: Thought content normal.        Judgment: Judgment normal.        Assessment:     Physical exam-several health maintenance issues addressed as below    Plan:     -Recommend Pneumovax -Discussed flu vaccine and she declines -We discussed Pap smear but she declines at this time.  She will consider this for next year -Set up repeat colonoscopy -Recommend mammogram which has not had in several years -Obtain screening labs -Discussed appropriate daily amounts for  calcium and vitamin D for postmenopausal status.  Eulas Post MD [facility]

## 2019-05-07 ENCOUNTER — Encounter: Payer: Self-pay | Admitting: Gastroenterology

## 2019-05-26 ENCOUNTER — Other Ambulatory Visit: Payer: Self-pay | Admitting: Family Medicine

## 2019-05-26 ENCOUNTER — Telehealth: Payer: Self-pay | Admitting: Family Medicine

## 2019-05-26 NOTE — Telephone Encounter (Signed)
Jenny Ewing from [place] is needing [place] to fax over information to the [employer]. The information they are needing is Clinicals. [employer] is out of network for the pt insurance and in order for her appt to stay for 4/30th for a mammogram and bone density this info has to be sent over.  Fax Info: [employer] must have ATTN: Precert #: [id number]  Must include ATTN and pt's name and ID on the cover page.

## 2019-06-01 ENCOUNTER — Encounter: Payer: Medicare Other | Admitting: Gastroenterology

## 2019-06-02 ENCOUNTER — Other Ambulatory Visit: Payer: Self-pay | Admitting: Family Medicine

## 2019-06-26 NOTE — Telephone Encounter (Signed)
Informed [facility]  That we dont do authorzation for DEXA  Scan

## 2019-07-30 ENCOUNTER — Telehealth: Payer: Medicare Other | Admitting: Emergency Medicine

## 2019-07-30 DIAGNOSIS — J302 Other seasonal allergic rhinitis: Secondary | ICD-10-CM

## 2019-07-30 MED ORDER — METHYLPREDNISOLONE 4 MG PO TBPK
ORAL_TABLET | ORAL | 0 refills | Status: DC
Start: 2019-07-30 — End: 2020-07-29

## 2019-07-30 MED ORDER — METHYLPREDNISOLONE 4 MG PO TBPK
ORAL_TABLET | ORAL | 0 refills | Status: DC
Start: 2019-07-30 — End: 2019-07-30

## 2019-07-30 NOTE — Progress Notes (Signed)
E visit for Allergic Rhinitis We are sorry that you are not feeling well.  Here is how we plan to help!  Based on what you have shared with me it looks like you have Seasonal allergies and Allergic Rhinitis.  Rhinitis is when a reaction occurs that causes nasal congestion, runny nose, sneezing, and itching.  Most types of rhinitis are caused by an inflammation and are associated with symptoms in the eyes ears or throat. There are several types of rhinitis.  The most common are acute rhinitis, which is usually caused by a viral illness, allergic or seasonal rhinitis, and nonallergic or year-round rhinitis.  Nasal allergies occur certain times of the year.  Allergic rhinitis is caused when allergens in the air trigger the release of histamine in the body.  Histamine causes itching, swelling, and fluid to build up in the fragile linings of the nasal passages, sinuses and eyelids.  An itchy nose and clear discharge are common.  I recommend the following over the counter treatments: Xyzal 5 mg take 1 tablet daily  I also would recommend a nasal spray: Flonase 2 sprays into each nostril once daily  You may also benefit from eye drops such as: Zaditor (ketotifen) Use as directed.  In addition to these medications I am ordering a short course of Steroids (Medrol taper) which should help reduce your immune reaction. While you begin to take these other medications   There are other medications such as Singulair which may be helpful but should be prescribed by your primary care physician.  HOME CARE:   You can use an over-the-counter saline nasal spray as needed  Avoid areas where there is heavy dust, mites, or molds  Stay indoors on windy days during the pollen season  Keep windows closed in home, at least in bedroom; use air conditioner.  Use high-efficiency house air filter  Keep windows closed in car, turn AC on re-circulate  Avoid playing out with dog during pollen season  GET HELP  RIGHT AWAY IF:   If your symptoms do not improve within 10 days  You become short of breath  You develop yellow or green discharge from your nose for over 3 days  You have coughing fits  MAKE SURE YOU:   Understand these instructions  Will watch your condition  Will get help right away if you are not doing well or get worse  Thank you for choosing an e-visit. Your e-visit answers were reviewed by a board certified advanced clinical practitioner to complete your personal care plan. Depending upon the condition, your plan could have included both over the counter or prescription medications. Please review your pharmacy choice. Be sure that the pharmacy you have chosen is open so that you can pick up your prescription now.  If there is a problem you may message your provider in [place] to have the prescription routed to another pharmacy. Your safety is important to [place]. If you have drug allergies check your prescription carefully.  For the next 24 hours, you can use MyChart to ask questions about today's visit, request a non-urgent call back, or ask for a work or school excuse from your e-visit provider. You will get an email in the next two days asking about your experience. I hope that your e-visit has been valuable and will speed your recovery.    Greater than 5 but less than 10 minutes spent researching, coordinating, and implementing care for this patient today

## 2019-07-30 NOTE — Addendum Note (Signed)
Addended by: Margarita Mail on: 07/30/2019 01:14 PM   Modules accepted: Orders

## 2019-07-31 ENCOUNTER — Ambulatory Visit: Payer: Medicare Other

## 2019-07-31 ENCOUNTER — Other Ambulatory Visit: Payer: Medicare Other

## 2019-08-20 IMAGING — CT CT ABD-PELV W/ CM
2 of 5 series · 16 of 46 positions shown, 18 images · IV contrast (iopamidol)
Comparison: None.

CLINICAL DATA: Initial evaluation for acute lower abdominal pain.

EXAM:
CT ABDOMEN AND PELVIS WITH CONTRAST
TECHNIQUE: Multidetector CT imaging of the abdomen and pelvis was performed
using the standard protocol following bolus administration of
intravenous contrast.
CONTRAST:  100mL MTC1XE-UBB IOPAMIDOL (MTC1XE-UBB) INJECTION 61%

[Series 3: a/p w/ 5mm · axial · 0.79mm/px · z∈[+759,+1124]mm · 13 of 83 slices shown, 15 images]
[im 5/83  soft-tissue]
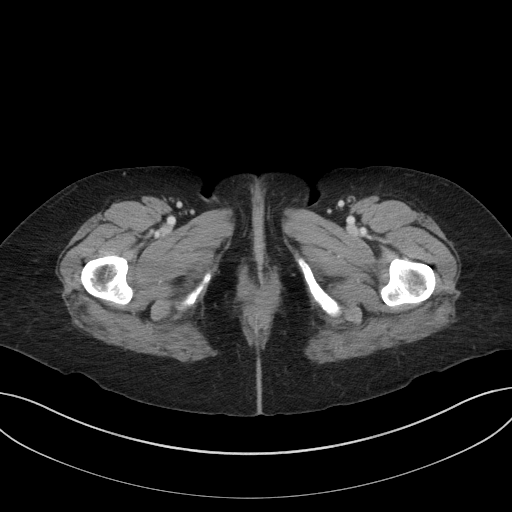
[im 5/83  bone]
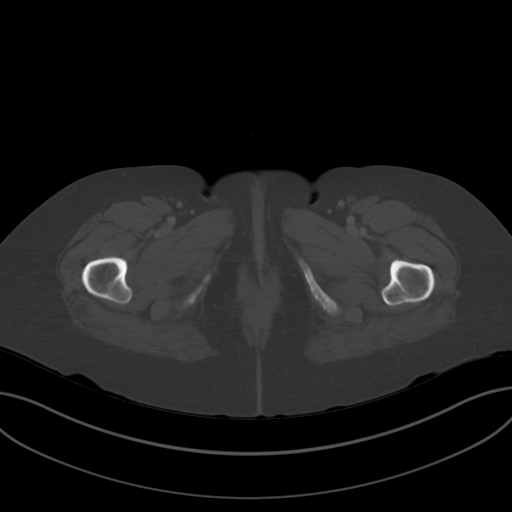
[im 13/83  soft-tissue]
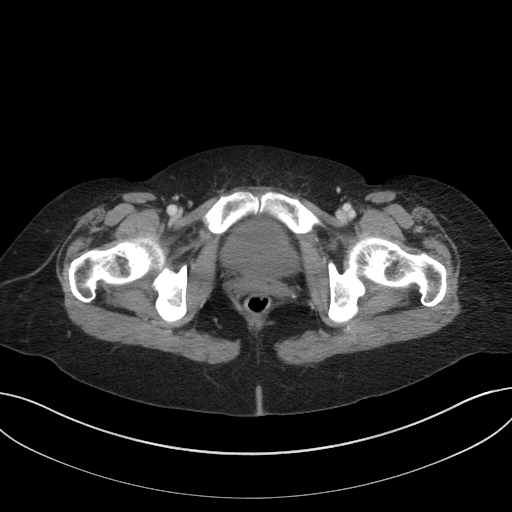
[im 17/83  soft-tissue]
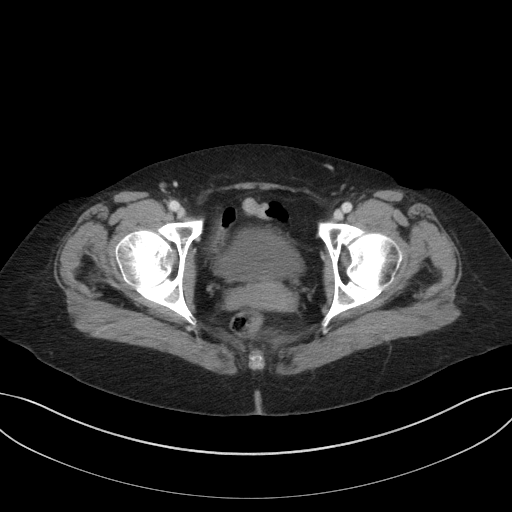
[im 25/83  soft-tissue]
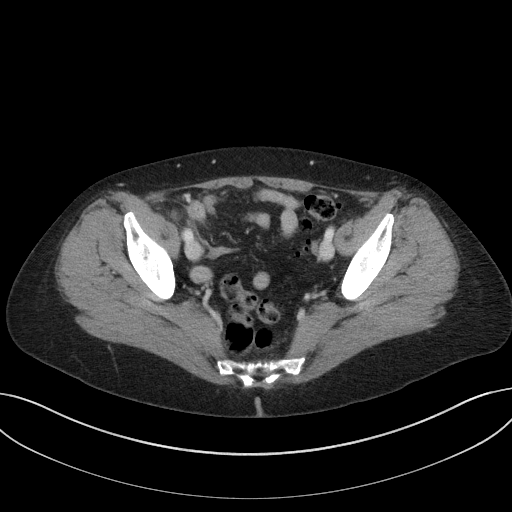
[im 29/83  soft-tissue]
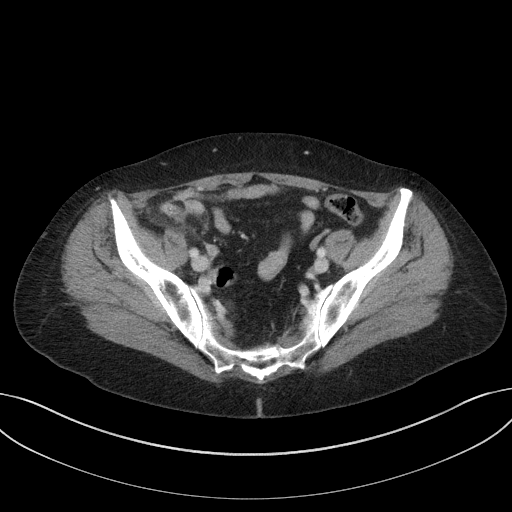
[im 37/83  soft-tissue]
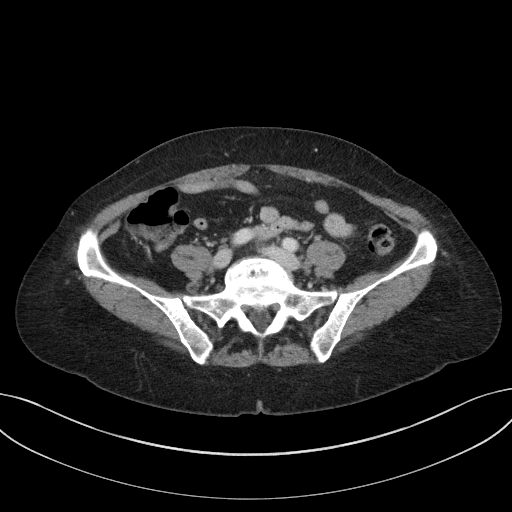
[im 42/83  soft-tissue]
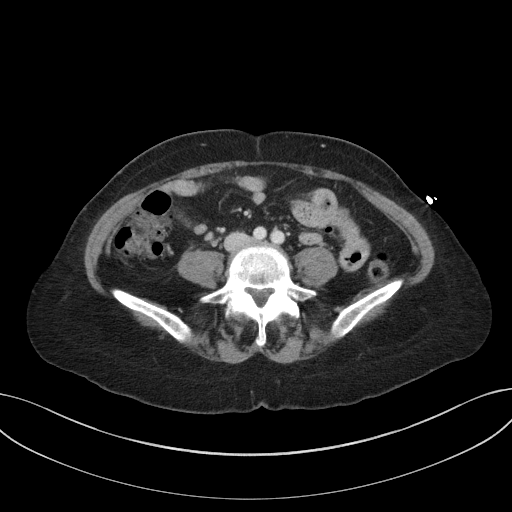
[im 46/83  soft-tissue]
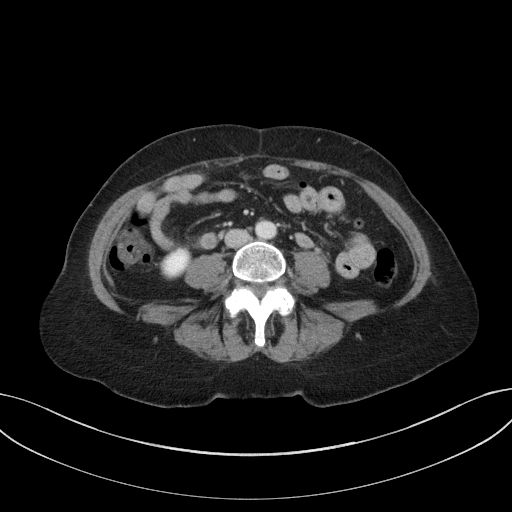
[im 54/83  soft-tissue]
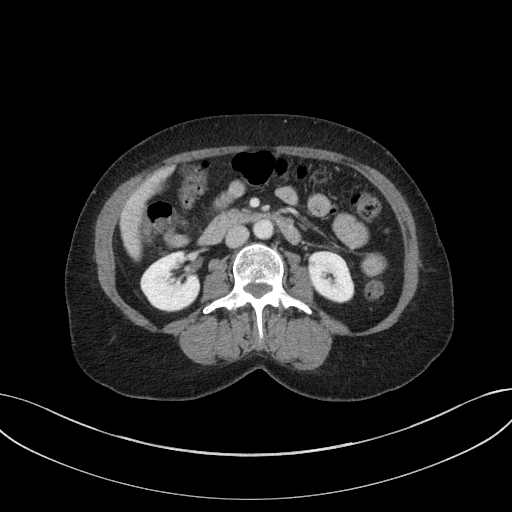
[im 54/83  bone]
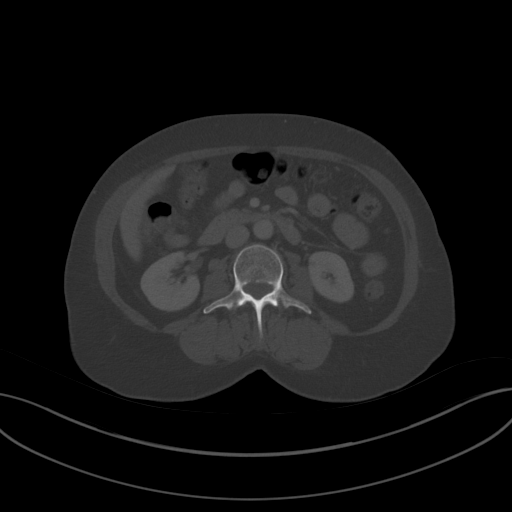
[im 58/83  soft-tissue]
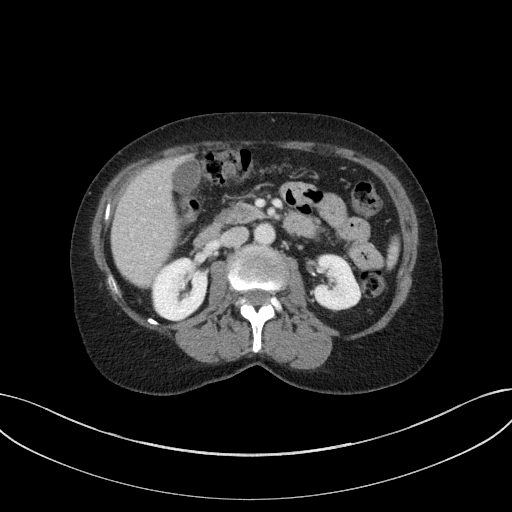
[im 66/83  soft-tissue]
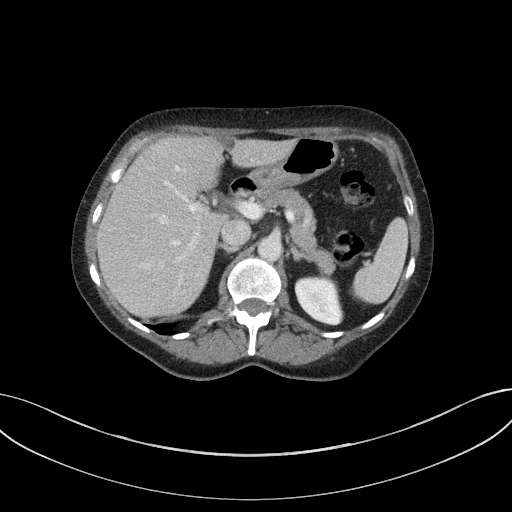
[im 70/83  soft-tissue]
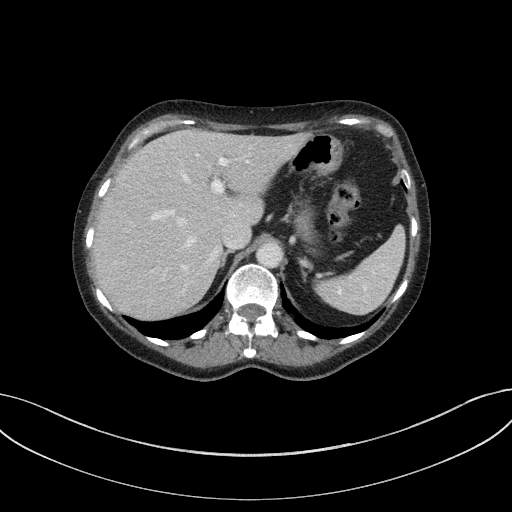
[im 78/83  soft-tissue]
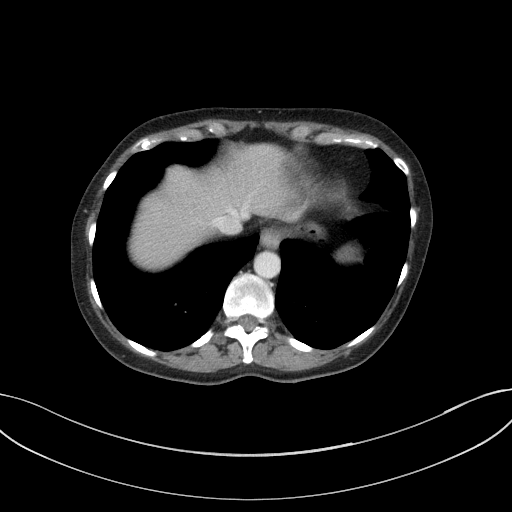

[Series 6: a/p w/ cor · coronal · 0.83mm/px · 3 of 112 slices shown]
[im 38/112  soft-tissue]
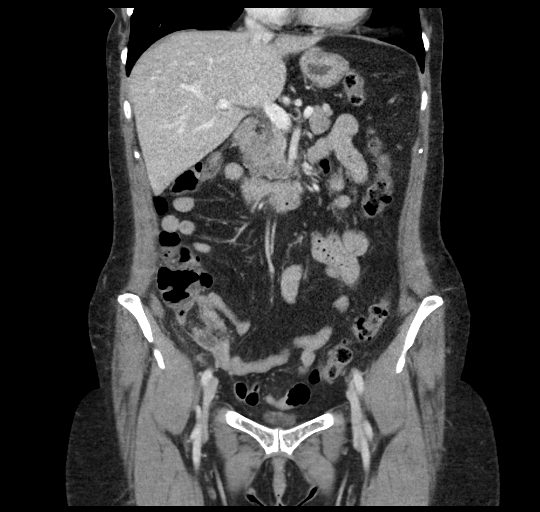
[im 50/112  soft-tissue]
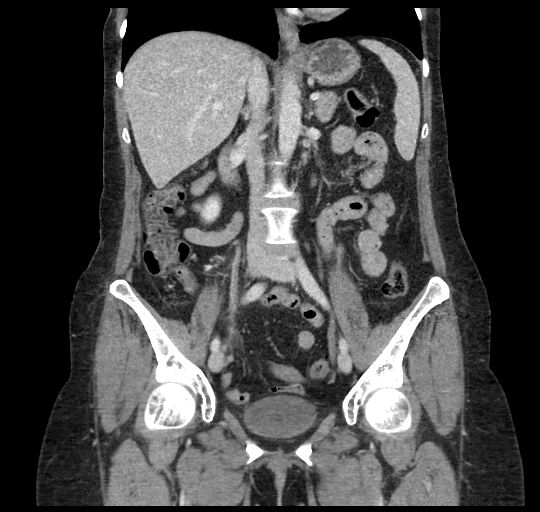
[im 62/112  soft-tissue]
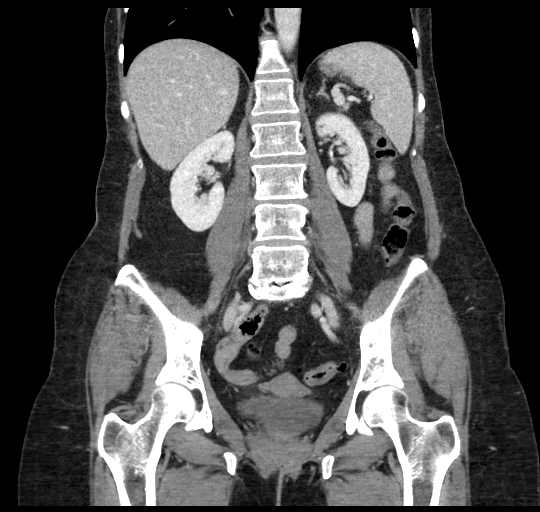

[16 of 46 positions shown; findings below may reference images not displayed]

FINDINGS: Lower chest: Mild scattered subsegmental atelectatic changes present
within the lung bases. 5 mm nodular density present at the
peripheral left lower lobe (series 5, image 3), indeterminate.
Visualized lungs are otherwise clear.

Hepatobiliary: Subcentimeter hypodensity within the peripheral right
hepatic lobe noted, too small the characterize, but of doubtful
significance. Focal fat deposition noted adjacent to the falciform
ligament. Liver otherwise a thin normal limits. Gallbladder normal.
No biliary dilatation.

Pancreas: Pancreas within normal limits.

Spleen: Spleen within normal limits.

Adrenals/Urinary Tract: Adrenal glands are normal. Kidneys equal in
size with symmetric enhancement. Few scattered subcentimeter
hypodensities noted, too small the characterize, but statistically
likely reflects small cyst. No nephrolithiasis, hydronephrosis, or
focal enhancing renal mass. No hydroureter. Partially distended
bladder within normal limits.

Stomach/Bowel: Stomach within normal limits. No evidence for bowel
obstruction. Sigmoid diverticulosis without evidence for acute
diverticulitis.

Appendix: Location: Right lower quadrant, extending inferiorly from
the cecum.

Diameter: Up to 13 mm at the appendiceal tip.

Appendicolith: Present at the mid aspect of the appendix (series 6,
image 37).

Mucosal hyper-enhancement: Present with adjacent periappendiceal fat
stranding about the appendiceal tip.

Extraluminal gas: Negative.

Periappendiceal collection: Periappendiceal fat stranding without
discrete collection.

Vascular/Lymphatic: Normal intravascular enhancement seen throughout
the intra-abdominal aorta and its branch vessels. No pathologically
enlarged intra-abdominal or pelvic lymph nodes.

Reproductive: Uterus and ovaries within normal limits.

Other: No free air or fluid. Tiny fat containing paraumbilical
hernia noted without associated inflammation.

Musculoskeletal: No acute osseous abnormality. No worrisome lytic or
blastic osseous lesions. Bilateral facet arthropathy noted at L4-5
and L5-S1.
IMPRESSION: 1. Findings consistent with acute tip appendicitis. No complication
identified.
2. No other acute abnormality within the abdomen and pelvis.
3. Sigmoid diverticulosis without evidence for acute diverticulitis.

## 2019-12-01 ENCOUNTER — Telehealth (INDEPENDENT_AMBULATORY_CARE_PROVIDER_SITE_OTHER): Payer: Medicare Other | Admitting: Family Medicine

## 2019-12-01 DIAGNOSIS — F339 Major depressive disorder, recurrent, unspecified: Secondary | ICD-10-CM

## 2019-12-01 MED ORDER — CITALOPRAM HYDROBROMIDE 40 MG PO TABS
40.0000 mg | ORAL_TABLET | Freq: Every day | ORAL | 3 refills | Status: DC
Start: 2019-12-01 — End: 2020-12-02

## 2019-12-01 NOTE — Progress Notes (Signed)
Patient ID: Jenny Ewing, female   DOB: [birth year], 64 y.o.   MRN: [id number]   This visit type was conducted due to national recommendations for restrictions regarding the COVID-19 pandemic in an effort to limit this patient's exposure and mitigate transmission in our community.   Virtual Visit via Telephone Note  I connected with Jenny Ewing on 12/01/19 at  4:00 PM EDT by telephone and verified that I am speaking with the correct person using two identifiers.   I discussed the limitations, risks, security and privacy concerns of performing an evaluation and management service by telephone and the availability of in person appointments. I also discussed with the patient that there may be a patient responsible charge related to this service. The patient expressed understanding and agreed to proceed.  Location patient: home Location provider: work or home office Participants present for the call: patient, provider Patient did not have a visit in the prior 7 days to address this/these issue(s).   History of Present Illness: Jenny Ewing has history of recurrent depression.  She has been on Celexa 40 mg daily for several years.  Little over a year ago she had a stepdaughter staying with them and there was quite a bit of conflict/stress related to that.  She had increased anxiety symptoms but those are settled at this time.  Her stepdaughter now has a place of her own.  Jenny Ewing feels that her mood is stable.  No suicidal ideation.  No side effects from Celexa.  Past Medical History:  Diagnosis Date  . DEPRESSION 01/27/2009   Past Surgical History:  Procedure Laterality Date  . APPENDECTOMY  07/29/2017   LAPROSCOPIC  . LAPAROSCOPIC APPENDECTOMY N/A 07/29/2017   Procedure: APPENDECTOMY LAPAROSCOPIC;  Surgeon: Donnie Mesa, MD;  Location: [facility];  Service: General;  Laterality: N/A;  . TUBAL LIGATION      reports that she has never smoked. She has never used smokeless tobacco. She reports current  alcohol use. She reports that she does not use drugs. family history includes Cancer in an other family member; Hypertension in her father, mother, and another family member; Stroke in an other family member. Allergies  Allergen Reactions  . Penicillins     REACTION: Hives      Observations/Objective: Patient sounds cheerful and well on the phone. I do not appreciate any SOB. Speech and thought processing are grossly intact. Patient reported vitals:  Assessment and Plan:  History of current depression stable on Celexa  -Refill Celexa 40 mg daily for 1 year -Consider complete physical at some point next year  Follow Up Instructions:  -As above   [id number] [id number] [id number] 11-20 [id number] 21-30 I did not refer this patient for an OV in the next 24 hours for this/these issue(s).  I discussed the assessment and treatment plan with the patient. The patient was provided an opportunity to ask questions and all were answered. The patient agreed with the plan and demonstrated an understanding of the instructions.   The patient was advised to call back or seek an in-person evaluation if the symptoms worsen or if the condition fails to improve as anticipated.  I provided 13 minutes of non-face-to-face time during this encounter.   Carolann Littler, MD

## 2020-07-29 ENCOUNTER — Ambulatory Visit (INDEPENDENT_AMBULATORY_CARE_PROVIDER_SITE_OTHER): Payer: Medicare Other

## 2020-07-29 DIAGNOSIS — Z1231 Encounter for screening mammogram for malignant neoplasm of breast: Secondary | ICD-10-CM | POA: Diagnosis not present

## 2020-07-29 DIAGNOSIS — Z1211 Encounter for screening for malignant neoplasm of colon: Secondary | ICD-10-CM | POA: Diagnosis not present

## 2020-07-29 DIAGNOSIS — Z0000 Encounter for general adult medical examination without abnormal findings: Secondary | ICD-10-CM | POA: Diagnosis not present

## 2020-07-29 NOTE — Patient Instructions (Signed)
Jenny Ewing , Thank you for taking time to come for your Medicare Wellness Visit. I appreciate your ongoing commitment to your health goals. Please review the following plan we discussed and let me know if I can assist you in the future.   Screening recommendations/referrals: Colonoscopy: referral completed 07/29/2020 Mammogram: referral completed 07/29/2020 Bone Density: referral completed 07/29/2020 Recommended yearly ophthalmology/optometry visit for glaucoma screening and checkup Recommended yearly dental visit for hygiene and checkup  Vaccinations: Influenza vaccine: current due fall 2022 Pneumococcal vaccine: completed series  Tdap vaccine: current due 06/10/2013 Shingles vaccine: pt will obtain   Advanced directives: will provide   Conditions/risks identified: none   Next appointment: none    Preventive Care 6 Years and Older, Female Preventive care refers to lifestyle choices and visits with your health care provider that can promote health and wellness. What does preventive care include?  A yearly physical exam. This is also called an annual well check.  Dental exams once or twice a year.  Routine eye exams. Ask your health care provider how often you should have your eyes checked.  Personal lifestyle choices, including:  Daily care of your teeth and gums.  Regular physical activity.  Eating a healthy diet.  Avoiding tobacco and drug use.  Limiting alcohol use.  Practicing safe sex.  Taking low-dose aspirin every day.  Taking vitamin and mineral supplements as recommended by your health care provider. What happens during an annual well check? The services and screenings done by your health care provider during your annual well check will depend on your age, overall health, lifestyle risk factors, and family history of disease. Counseling  Your health care provider may ask you questions about your:  Alcohol use.  Tobacco use.  Drug use.  Emotional  well-being.  Home and relationship well-being.  Sexual activity.  Eating habits.  History of falls.  Memory and ability to understand (cognition).  Work and work Statistician.  Reproductive health. Screening  You may have the following tests or measurements:  Height, weight, and BMI.  Blood pressure.  Lipid and cholesterol levels. These may be checked every 5 years, or more frequently if you are over 29 years old.  Skin check.  Lung cancer screening. You may have this screening every year starting at age 19 if you have a 30-pack-year history of smoking and currently smoke or have quit within the past 15 years.  Fecal occult blood test (FOBT) of the stool. You may have this test every year starting at age 28.  Flexible sigmoidoscopy or colonoscopy. You may have a sigmoidoscopy every 5 years or a colonoscopy every 10 years starting at age 54.  Hepatitis C blood test.  Hepatitis B blood test.  Sexually transmitted disease (STD) testing.  Diabetes screening. This is done by checking your blood sugar (glucose) after you have not eaten for a while (fasting). You may have this done every 1-3 years.  Bone density scan. This is done to screen for osteoporosis. You may have this done starting at age 35.  Mammogram. This may be done every 1-2 years. Talk to your health care provider about how often you should have regular mammograms. Talk with your health care provider about your test results, treatment options, and if necessary, the need for more tests. Vaccines  Your health care provider may recommend certain vaccines, such as:  Influenza vaccine. This is recommended every year.  Tetanus, diphtheria, and acellular pertussis (Tdap, Td) vaccine. You may need a Td booster every 10  years.  Zoster vaccine. You may need this after age 27.  Pneumococcal 13-valent conjugate (PCV13) vaccine. One dose is recommended after age 36.  Pneumococcal polysaccharide (PPSV23) vaccine. One  dose is recommended after age 71. Talk to your health care provider about which screenings and vaccines you need and how often you need them. This information is not intended to replace advice given to you by your health care provider. Make sure you discuss any questions you have with your health care provider. Document Released: 04/15/2015 Document Revised: 12/07/2015 Document Reviewed: 01/18/2015 Elsevier Interactive Patient Education  2017 [place] Prevention in the Home Falls can cause injuries. They can happen to people of all ages. There are many things you can do to make your home safe and to help prevent falls. What can I do on the outside of my home?  Regularly fix the edges of walkways and driveways and fix any cracks.  Remove anything that might make you trip as you walk through a door, such as a raised step or threshold.  Trim any bushes or trees on the path to your home.  Use bright outdoor lighting.  Clear any walking paths of anything that might make someone trip, such as rocks or tools.  Regularly check to see if handrails are loose or broken. Make sure that both sides of any steps have handrails.  Any raised decks and porches should have guardrails on the edges.  Have any leaves, snow, or ice cleared regularly.  Use sand or salt on walking paths during winter.  Clean up any spills in your garage right away. This includes oil or grease spills. What can I do in the bathroom?  Use night lights.  Install grab bars by the toilet and in the tub and shower. Do not use towel bars as grab bars.  Use non-skid mats or decals in the tub or shower.  If you need to sit down in the shower, use a plastic, non-slip stool.  Keep the floor dry. Clean up any water that spills on the floor as soon as it happens.  Remove soap buildup in the tub or shower regularly.  Attach bath mats securely with double-sided non-slip rug tape.  Do not have throw rugs and other  things on the floor that can make you trip. What can I do in the bedroom?  Use night lights.  Make sure that you have a light by your bed that is easy to reach.  Do not use any sheets or blankets that are too big for your bed. They should not hang down onto the floor.  Have a firm chair that has side arms. You can use this for support while you get dressed.  Do not have throw rugs and other things on the floor that can make you trip. What can I do in the kitchen?  Clean up any spills right away.  Avoid walking on wet floors.  Keep items that you use a lot in easy-to-reach places.  If you need to reach something above you, use a strong step stool that has a grab bar.  Keep electrical cords out of the way.  Do not use floor polish or wax that makes floors slippery. If you must use wax, use non-skid floor wax.  Do not have throw rugs and other things on the floor that can make you trip. What can I do with my stairs?  Do not leave any items on the stairs.  Make sure that  there are handrails on both sides of the stairs and use them. Fix handrails that are broken or loose. Make sure that handrails are as long as the stairways.  Check any carpeting to make sure that it is firmly attached to the stairs. Fix any carpet that is loose or worn.  Avoid having throw rugs at the top or bottom of the stairs. If you do have throw rugs, attach them to the floor with carpet tape.  Make sure that you have a light switch at the top of the stairs and the bottom of the stairs. If you do not have them, ask someone to add them for you. What else can I do to help prevent falls?  Wear shoes that:  Do not have high heels.  Have rubber bottoms.  Are comfortable and fit you well.  Are closed at the toe. Do not wear sandals.  If you use a stepladder:  Make sure that it is fully opened. Do not climb a closed stepladder.  Make sure that both sides of the stepladder are locked into place.  Ask  someone to hold it for you, if possible.  Clearly mark and make sure that you can see:  Any grab bars or handrails.  First and last steps.  Where the edge of each step is.  Use tools that help you move around (mobility aids) if they are needed. These include:  Canes.  Walkers.  Scooters.  Crutches.  Turn on the lights when you go into a dark area. Replace any light bulbs as soon as they burn out.  Set up your furniture so you have a clear path. Avoid moving your furniture around.  If any of your floors are uneven, fix them.  If there are any pets around you, be aware of where they are.  Review your medicines with your doctor. Some medicines can make you feel dizzy. This can increase your chance of falling. Ask your doctor what other things that you can do to help prevent falls. This information is not intended to replace advice given to you by your health care provider. Make sure you discuss any questions you have with your health care provider. Document Released: 01/13/2009 Document Revised: 08/25/2015 Document Reviewed: 04/23/2014 Elsevier Interactive Patient Education  2017 [employer].

## 2020-07-29 NOTE — Progress Notes (Signed)
Subjective:   Jenny Ewing is a 65 y.o. female who presents for an Initial Medicare Annual Wellness Visit.   Virtual Visit via Video Note  I connected with Jenny Ewing  by a video enabled telemedicine application and verified that I am speaking with the correct person using two identifiers.  Location: Patient: Home Provider: Office Persons participating in the virtual visit: patient, provider   I discussed the limitations of evaluation and management by telemedicine and the availability of in person appointments. The patient expressed understanding and agreed to proceed.     Mickel Baas Magdelene Ruark,LPN   Review of Systems    N/A  Cardiac Risk Factors include: advanced age (>15men, >5 women)     Objective:    Today's Vitals   There is no height or weight on file to calculate BMI.  Advanced Directives 07/29/2020 07/28/2017  Does Patient Have a Medical Advance Directive? Yes No  Type of Paramedic of [place];Living will -  Does patient want to make changes to medical advance directive? No - Patient declined -  Copy of [place] in Chart? No - copy requested -  Would patient like information on creating a medical advance directive? - No - Patient declined    Current Medications (verified) Outpatient Encounter Medications as of 07/29/2020  Medication Sig  . citalopram (CELEXA) 40 MG tablet Take 1 tablet (40 mg total) by mouth daily.  Marland Kitchen levocetirizine (XYZAL) 2.5 MG/5ML solution Take 2.5 mg by mouth every evening.  . [DISCONTINUED] methylPREDNISolone (MEDROL DOSEPAK) 4 MG TBPK tablet Use as directed   No facility-administered encounter medications on file as of 07/29/2020.    Allergies (verified) Penicillins   History: Past Medical History:  Diagnosis Date  . DEPRESSION 01/27/2009   Past Surgical History:  Procedure Laterality Date  . APPENDECTOMY  07/29/2017   LAPROSCOPIC  . LAPAROSCOPIC APPENDECTOMY N/A 07/29/2017    Procedure: APPENDECTOMY LAPAROSCOPIC;  Surgeon: Donnie Mesa, MD;  Location: [facility];  Service: General;  Laterality: N/A;  . TUBAL LIGATION     Family History  Problem Relation Age of Onset  . Cancer Other        colon  . Hypertension Other   . Stroke Other   . Hypertension Mother   . Hypertension Father    Social History   Socioeconomic History  . Marital status: Married    Spouse name: Not on file  . Number of children: Not on file  . Years of education: Not on file  . Highest education level: Not on file  Occupational History  . Not on file  Tobacco Use  . Smoking status: Never Smoker  . Smokeless tobacco: Never Used  Vaping Use  . Vaping Use: Never used  Substance and Sexual Activity  . Alcohol use: Yes    Comment: OCCASIONAL  . Drug use: Never  . Sexual activity: Not on file  Other Topics Concern  . Not on file  Social History Narrative  . Not on file   Social Determinants of Health   Financial Resource Strain: Low Risk   . Difficulty of Paying Living Expenses: Not hard at all  Food Insecurity: No Food Insecurity  . Worried About Charity fundraiser in the Last Year: Never true  . Ran Out of Food in the Last Year: Never true  Transportation Needs: No Transportation Needs  . Lack of Transportation (Medical): No  . Lack of Transportation (Non-Medical): No  Physical Activity: Insufficiently Active  .  Days of Exercise per Week: 1 day  . Minutes of Exercise per Session: 10 min  Stress: No Stress Concern Present  . Feeling of Stress : Not at all  Social Connections: Moderately Integrated  . Frequency of Communication with Friends and Family: More than three times a week  . Frequency of Social Gatherings with Friends and Family: More than three times a week  . Attends Religious Services: 1 to 4 times per year  . Active Member of Clubs or Organizations: No  . Attends Archivist Meetings: Never  . Marital Status: Married    Tobacco  Counseling Counseling given: Not Answered   Clinical Intake:  Pre-visit preparation completed: Yes  Pain : No/denies pain     Nutritional Risks: None Diabetes: No  How often do you need to have someone help you when you read instructions, pamphlets, or other written materials from your doctor or pharmacy?: 1 - Never What is the last grade level you completed in school?: college  Diabetic?no  Interpreter Needed?: No  Information entered by :: Mickel Baas.Amit Meloy,LPN   Activities of Daily Living In your present state of health, do you have any difficulty performing the following activities: 07/29/2020  Hearing? N  Vision? N  Difficulty concentrating or making decisions? N  Walking or climbing stairs? N  Dressing or bathing? N  Doing errands, shopping? N  Preparing Food and eating ? N  Using the Toilet? N  In the past six months, have you accidently leaked urine? N  Do you have problems with loss of bowel control? N  Managing your Medications? N  Managing your Finances? N  Housekeeping or managing your Housekeeping? N  Some recent data might be hidden    Patient Care Team: Eulas Post, MD as PCP - General  Indicate any recent Medical Services you may have received from other than [facility] providers in the past year (date may be approximate).     Assessment:   This is a routine wellness examination for Jenny Ewing.  Hearing/Vision screen  Hearing Screening   125Hz  250Hz  500Hz  1000Hz  2000Hz  3000Hz  4000Hz  6000Hz  8000Hz   Right ear:           Left ear:           Vision Screening Comments: Annual eye exam wears glasses  Dietary issues and exercise activities discussed: Current Exercise Habits: The patient does not participate in regular exercise at present, Exercise limited by: None identified  Goals    . Exercise 3x per week (30 min per time)      Depression Screen PHQ 2/9 Scores 07/29/2020 07/29/2020 05/06/2019  PHQ - 2 Score 0 0 0  PHQ- 9 Score - - 0    Fall  Risk Fall Risk  07/29/2020 05/06/2019  Falls in the past year? 0 0  Number falls in past yr: 0 0  Injury with Fall? 0 0    FALL RISK PREVENTION PERTAINING TO THE HOME:  Any stairs in or around the home? No  If so, are there any without handrails? No  Home free of loose throw rugs in walkways, pet beds, electrical cords, etc? Yes  Adequate lighting in your home to reduce risk of falls? No   ASSISTIVE DEVICES UTILIZED TO PREVENT FALLS:  Life alert? No  Use of a cane, walker or w/c? No  Grab bars in the bathroom? Yes  Shower chair or bench in shower? Yes  Elevated toilet seat or a handicapped toilet? Yes  Cognitive Function:     Normal cognitive status assessed by direct observation by this Nurse Health Advisor. No abnormalities found.      Immunizations Immunization History  Administered Date(s) Administered  . Pneumococcal Polysaccharide-23 05/06/2019  . Tdap 06/10/2013    TDAP status: Up to date  Flu Vaccine status: Up to date  Pneumococcal vaccine status: Declined,  Education has been provided regarding the importance of this vaccine but patient still declined. Advised may receive this vaccine at local pharmacy or Health Dept. Aware to provide a copy of the vaccination record if obtained from local pharmacy or Health Dept. Verbalized acceptance and understanding.   Covid-19 vaccine status: Declined, Education has been provided regarding the importance of this vaccine but patient still declined. Advised may receive this vaccine at local pharmacy or Health Dept.or vaccine clinic. Aware to provide a copy of the vaccination record if obtained from local pharmacy or Health Dept. Verbalized acceptance and understanding.  Qualifies for Shingles Vaccine? Yes   Zostavax completed No   Shingrix Completed?: No.    Education has been provided regarding the importance of this vaccine. Patient has been advised to call insurance company to determine out of pocket expense if they  have not yet received this vaccine. Advised may also receive vaccine at local pharmacy or Health Dept. Verbalized acceptance and understanding.  Screening Tests Health Maintenance  Topic Date Due  . COVID-19 Vaccine (1) Never done  . MAMMOGRAM  09/04/2015  . COLONOSCOPY (Pts 45-49yrs Insurance coverage will need to be confirmed)  04/02/2016  . DEXA SCAN  Never done  . PNA vac Low Risk Adult (2 of 2 - PCV13) 05/05/2020  . INFLUENZA VACCINE  10/31/2020  . TETANUS/TDAP  06/11/2023  . Hepatitis C Screening  Completed  . HPV VACCINES  Aged Out    Health Maintenance  Health Maintenance Due  Topic Date Due  . COVID-19 Vaccine (1) Never done  . MAMMOGRAM  09/04/2015  . COLONOSCOPY (Pts 45-63yrs Insurance coverage will need to be confirmed)  04/02/2016  . DEXA SCAN  Never done  . PNA vac Low Risk Adult (2 of 2 - PCV13) 05/05/2020    Colorectal cancer screening: Referral to GI placed 07/29/2020. Pt aware the office will call re: appt.  Mammogram status: Ordered 07/29/2020. Pt provided with contact info and advised to call to schedule appt.   Bone Density status: Ordered 07/29/2020. Pt provided with contact info and advised to call to schedule appt.  Lung Cancer Screening: (Low Dose CT Chest recommended if Age 11-80 years, 30 pack-year currently smoking OR have quit w/in 15years.) does qualify.   Lung Cancer Screening Referral: n/a  Additional Screening:  Hepatitis C Screening: does qualify; Completed 04/22/2017  Vision Screening: Recommended annual ophthalmology exams for early detection of glaucoma and other disorders of the eye. Is the patient up to date with their annual eye exam?  Yes  Who is the provider or what is the name of the office in which the patient attends annual eye exams? The eye doctor If pt is not established with a provider, would they like to be referred to a provider to establish care? No .   Dental Screening: Recommended annual dental exams for proper oral  hygiene  Community Resource Referral / Chronic Care Management: CRR required this visit?  No   CCM required this visit?  No      Plan:     I have personally reviewed and noted the following in the patient's chart:   .  Medical and social history . Use of alcohol, tobacco or illicit drugs  . Current medications and supplements . Functional ability and status . Nutritional status . Physical activity . Advanced directives . List of other physicians . Hospitalizations, surgeries, and ER visits in previous 12 months . Vitals . Screenings to include cognitive, depression, and falls . Referrals and appointments  In addition, I have reviewed and discussed with patient certain preventive protocols, quality metrics, and best practice recommendations. A written personalized care plan for preventive services as well as general preventive health recommendations were provided to patient.     Randel Pigg, LPN   [id number]   Nurse Notes: none

## 2020-08-17 ENCOUNTER — Encounter: Payer: Self-pay | Admitting: Family Medicine

## 2020-08-17 ENCOUNTER — Encounter: Payer: Self-pay | Admitting: Gastroenterology

## 2020-08-26 ENCOUNTER — Other Ambulatory Visit: Payer: Self-pay

## 2020-08-26 ENCOUNTER — Ambulatory Visit (AMBULATORY_SURGERY_CENTER): Payer: Medicare Other | Admitting: *Deleted

## 2020-08-26 VITALS — Ht 65.0 in | Wt 145.0 lb

## 2020-08-26 DIAGNOSIS — Z1211 Encounter for screening for malignant neoplasm of colon: Secondary | ICD-10-CM

## 2020-08-26 MED ORDER — ONDANSETRON HCL 4 MG PO TABS: 4.0000 mg | ORAL_TABLET | ORAL | 0 refills | Status: DC

## 2020-08-26 NOTE — Progress Notes (Addendum)
Patient's pre-visit was done today over the phone with the patient due to COVID-19 pandemic. Name,DOB and address verified. Insurance verified. Patient denies any allergies to Eggs and Soy. Patient denies any problems with anesthesia/sedation. Patient denies taking diet pills or blood thinners. Packet of Prep instructions mailed to patient including a copy of a consent form-pt is aware. Patient understands to call us back with any questions or concerns. Patient is aware of our care-partner policy and FVCBS-49 safety protocol. EMMI education assigned to the patient for the procedure, sent to [facility].

## 2020-08-31 ENCOUNTER — Other Ambulatory Visit: Payer: Medicare Other

## 2020-09-02 ENCOUNTER — Other Ambulatory Visit: Payer: Self-pay

## 2020-09-02 ENCOUNTER — Ambulatory Visit (INDEPENDENT_AMBULATORY_CARE_PROVIDER_SITE_OTHER)
Admission: RE | Admit: 2020-09-02 | Discharge: 2020-09-02 | Disposition: A | Discharge status: Home / Self Care | Payer: Medicare Other | Source: Ambulatory Visit | Attending: Family Medicine | Admitting: Family Medicine

## 2020-09-02 DIAGNOSIS — Z0000 Encounter for general adult medical examination without abnormal findings: Secondary | ICD-10-CM

## 2020-09-04 DIAGNOSIS — Z0000 Encounter for general adult medical examination without abnormal findings: Secondary | ICD-10-CM | POA: Diagnosis not present

## 2020-09-06 ENCOUNTER — Encounter: Payer: Self-pay | Admitting: Gastroenterology

## 2020-09-09 ENCOUNTER — Encounter: Payer: Self-pay | Admitting: Gastroenterology

## 2020-09-09 ENCOUNTER — Other Ambulatory Visit: Payer: Self-pay

## 2020-09-09 ENCOUNTER — Ambulatory Visit (AMBULATORY_SURGERY_CENTER): Payer: Medicare Other | Admitting: Gastroenterology

## 2020-09-09 VITALS — BP 130/77 | HR 74 | Temp 98.0°F | Resp 19 | Ht 65.0 in | Wt 145.0 lb

## 2020-09-09 DIAGNOSIS — D128 Benign neoplasm of rectum: Secondary | ICD-10-CM

## 2020-09-09 DIAGNOSIS — Z1211 Encounter for screening for malignant neoplasm of colon: Secondary | ICD-10-CM

## 2020-09-09 DIAGNOSIS — F329 Major depressive disorder, single episode, unspecified: Secondary | ICD-10-CM | POA: Diagnosis not present

## 2020-09-09 DIAGNOSIS — K6289 Other specified diseases of anus and rectum: Secondary | ICD-10-CM

## 2020-09-09 DIAGNOSIS — D124 Benign neoplasm of descending colon: Secondary | ICD-10-CM | POA: Diagnosis not present

## 2020-09-09 DIAGNOSIS — D129 Benign neoplasm of anus and anal canal: Secondary | ICD-10-CM

## 2020-09-09 MED ORDER — SODIUM CHLORIDE 0.9 % IV SOLN: 500.0000 mL | Freq: Once | INTRAVENOUS | Status: DC

## 2020-09-09 NOTE — Patient Instructions (Signed)
Impression/Recommendations:  Polyp, hemorrhoid, diverticulosis, and High fiber diet handouts given to patient.  High fiber diet. Use FiberCon 1-2 tablets by mouth daily.  Continue present medications. Await pathology results.  Repeat colonoscopy in likely 3 years for surveillance.    YOU HAD AN ENDOSCOPIC PROCEDURE TODAY AT [facility]:   Refer to the procedure report that was given to you for any specific questions about what was found during the examination.  If the procedure report does not answer your questions, please call your gastroenterologist to clarify.  If you requested that your care partner not be given the details of your procedure findings, then the procedure report has been included in a sealed envelope for you to review at your convenience later.  YOU SHOULD EXPECT: Some feelings of bloating in the abdomen. Passage of more gas than usual.  Walking can help get rid of the air that was put into your GI tract during the procedure and reduce the bloating. If you had a lower endoscopy (such as a colonoscopy or flexible sigmoidoscopy) you may notice spotting of blood in your stool or on the toilet paper. If you underwent a bowel prep for your procedure, you may not have a normal bowel movement for a few days.  Please Note:  You might notice some irritation and congestion in your nose or some drainage.  This is from the oxygen used during your procedure.  There is no need for concern and it should clear up in a day or so.  SYMPTOMS TO REPORT IMMEDIATELY:  Following lower endoscopy (colonoscopy or flexible sigmoidoscopy):  Excessive amounts of blood in the stool  Significant tenderness or worsening of abdominal pains  Swelling of the abdomen that is new, acute  Fever of 100F or higher   For urgent or emergent issues, a gastroenterologist can be reached at any hour by calling [phone]. Do not use MyChart messaging for urgent concerns.    DIET:  We do  recommend a small meal at first, but then you may proceed to your regular diet.  Drink plenty of fluids but you should avoid alcoholic beverages for 24 hours.  ACTIVITY:  You should plan to take it easy for the rest of today and you should NOT DRIVE or use heavy machinery until tomorrow (because of the sedation medicines used during the test).    FOLLOW UP: Our staff will call the number listed on your records 48-72 hours following your procedure to check on you and address any questions or concerns that you may have regarding the information given to you following your procedure. If we do not reach you, we will leave a message.  We will attempt to reach you two times.  During this call, we will ask if you have developed any symptoms of COVID 19. If you develop any symptoms (ie: fever, flu-like symptoms, shortness of breath, cough etc.) before then, please call [phone].  If you test positive for Covid 19 in the 2 weeks post procedure, please call and report this information to [place].    If any biopsies were taken you will be contacted by phone or by letter within the next 1-3 weeks.  Please call us at [phone] if you have not heard about the biopsies in 3 weeks.    SIGNATURES/CONFIDENTIALITY: You and/or your care partner have signed paperwork which will be entered into your electronic medical record.  These signatures attest to the fact that that the information above on your After  Visit Summary has been reviewed and is understood.  Full responsibility of the confidentiality of this discharge information lies with you and/or your care-partner.

## 2020-09-09 NOTE — Progress Notes (Signed)
Called to room to assist during endoscopic procedure.  Patient ID and intended procedure confirmed with present staff. Received instructions for my participation in the procedure from the performing physician.  

## 2020-09-09 NOTE — Progress Notes (Signed)
A/ox3, pleased with MAC, report to RN 

## 2020-09-09 NOTE — Op Note (Signed)
[facility] Patient Name: Jenny Ewing Procedure Date: 09/09/2020 3:25 PM MRN: [id number] Endoscopist: Justice Britain , MD Age: 65 Referring MD:  Date of Birth: 08-May-1955 Gender: Female Account #: [id number] Procedure:                Colonoscopy Indications:              Screening for colorectal malignant neoplasm Medicines:                Monitored Anesthesia Care Procedure:                Pre-Anesthesia Assessment:                           - Prior to the procedure, a History and Physical                            was performed, and patient medications and                            allergies were reviewed. The patient's tolerance of                            previous anesthesia was also reviewed. The risks                            and benefits of the procedure and the sedation                            options and risks were discussed with the patient.                            All questions were answered, and informed consent                            was obtained. Prior Anticoagulants: The patient has                            taken no previous anticoagulant or antiplatelet                            agents. ASA Grade Assessment: II - A patient with                            mild systemic disease. After reviewing the risks                            and benefits, the patient was deemed in                            satisfactory condition to undergo the procedure.                           After obtaining informed consent, the colonoscope  was passed under direct vision. Throughout the                            procedure, the patient's blood pressure, pulse, and                            oxygen saturations were monitored continuously. The                            Olympus PFC-[id number] (#[id number]) Colonoscope was                            introduced through the anus and advanced to the 5                            cm into the  ileum. The colonoscopy was performed                            without difficulty. The patient tolerated the                            procedure. The quality of the bowel preparation was                            adequate. The terminal ileum, ileocecal valve,                            appendiceal orifice, and rectum were photographed. Scope In: 3:45:28 PM Scope Out: 4:08:49 PM Scope Withdrawal Time: 0 hours 17 minutes 45 seconds  Total Procedure Duration: 0 hours 23 minutes 21 seconds  Findings:                 The digital rectal exam findings include                            hemorrhoids. Pertinent negatives include no                            palpable rectal lesions.                           The colon (entire examined portion) was                            significantly tortuous.                           The terminal ileum and ileocecal valve appeared                            normal.                           Five sessile polyps were found in the rectum (3)  and descending colon (2). The polyps were 2 to 10                            mm in size. These polyps were removed with a cold                            snare. Resection and retrieval were complete.                           Multiple small-mouthed diverticula were found in                            the recto-sigmoid colon, sigmoid colon and                            descending colon.                           Normal mucosa was found in the entire colon                            otherwise.                           Non-bleeding non-thrombosed external and internal                            hemorrhoids were found during retroflexion, during                            perianal exam and during digital exam. The                            hemorrhoids were Grade II (internal hemorrhoids                            that prolapse but reduce spontaneously). Complications:            No immediate  complications. Estimated Blood Loss:     Estimated blood loss was minimal. Impression:               - Hemorrhoids found on digital rectal exam.                           - Tortuous colon.                           - The examined portion of the ileum was normal.                           - Five 2 to 10 mm polyps in the rectum and in the                            descending colon, removed with a cold snare.  Resected and retrieved.                           - Diverticulosis in the recto-sigmoid colon, in the                            sigmoid colon and in the descending colon.                           - Normal mucosa in the entire examined colon                            otherwise.                           - Non-bleeding non-thrombosed external and internal                            hemorrhoids. Recommendation:           - The patient will be observed post-procedure,                            until all discharge criteria are met.                           - Discharge patient to home.                           - Patient has a contact number available for                            emergencies. The signs and symptoms of potential                            delayed complications were discussed with the                            patient. Return to normal activities tomorrow.                            Written discharge instructions were provided to the                            patient.                           - High fiber diet.                           - Use FiberCon 1-2 tablets PO daily.                           - Continue present medications.                           - Await pathology results.                           -  Repeat colonoscopy in likely 3 years for                            surveillance based on pathology results (largest                            polyp has typical adenomatous appearance).                           - The findings and  recommendations were discussed                            with the patient.                           - The findings and recommendations were discussed                            with the patient's family. Justice Britain, MD 09/09/2020 4:18:27 PM

## 2020-09-09 NOTE — Progress Notes (Signed)
Medical history reviewed with no changes since PV. VS assessed by [facility]

## 2020-09-13 ENCOUNTER — Telehealth: Payer: Self-pay

## 2020-09-13 ENCOUNTER — Telehealth: Payer: Self-pay | Admitting: *Deleted

## 2020-09-13 NOTE — Telephone Encounter (Signed)
  Follow up Call-  Call back number 09/09/2020  Post procedure Call Back phone  # [phone]  Permission to leave phone message Yes  Some recent data might be hidden     Patient questions:  Do you have a fever, pain , or abdominal swelling? No. Pain Score  0 *  Have you tolerated food without any problems? Yes.    Have you been able to return to your normal activities? Yes.    Do you have any questions about your discharge instructions: Diet   No. Medications  No. Follow up visit  No.  Do you have questions or concerns about your Care? No.  Actions: * If pain score is 4 or above: No action needed, pain <4.

## 2020-09-13 NOTE — Telephone Encounter (Signed)
First attempt follow up call to pt, lm on vm 

## 2020-09-17 ENCOUNTER — Encounter: Payer: Self-pay | Admitting: Gastroenterology

## 2020-10-28 ENCOUNTER — Other Ambulatory Visit: Payer: Self-pay

## 2020-10-28 ENCOUNTER — Ambulatory Visit
Admission: RE | Admit: 2020-10-28 | Discharge: 2020-10-28 | Disposition: A | Discharge status: Home / Self Care | Payer: Medicare Other | Source: Ambulatory Visit | Attending: Family Medicine | Admitting: Family Medicine

## 2020-10-28 DIAGNOSIS — Z1231 Encounter for screening mammogram for malignant neoplasm of breast: Secondary | ICD-10-CM | POA: Diagnosis not present

## 2020-12-02 ENCOUNTER — Other Ambulatory Visit: Payer: Self-pay

## 2020-12-02 ENCOUNTER — Encounter: Payer: Self-pay | Admitting: Family Medicine

## 2020-12-02 ENCOUNTER — Ambulatory Visit (INDEPENDENT_AMBULATORY_CARE_PROVIDER_SITE_OTHER): Payer: Medicare Other | Admitting: Family Medicine

## 2020-12-02 VITALS — BP 124/80 | HR 90 | Temp 98.1°F | Ht 65.0 in | Wt 137.2 lb

## 2020-12-02 DIAGNOSIS — Z0000 Encounter for general adult medical examination without abnormal findings: Secondary | ICD-10-CM

## 2020-12-02 DIAGNOSIS — Z23 Encounter for immunization: Secondary | ICD-10-CM | POA: Diagnosis not present

## 2020-12-02 DIAGNOSIS — M8580 Other specified disorders of bone density and structure, unspecified site: Secondary | ICD-10-CM | POA: Diagnosis not present

## 2020-12-02 MED ORDER — CITALOPRAM HYDROBROMIDE 40 MG PO TABS: 40.0000 mg | ORAL_TABLET | Freq: Every day | ORAL | 3 refills | Status: DC

## 2020-12-02 NOTE — Progress Notes (Signed)
Established Patient Office Visit  Subjective:  Patient ID: Jenny Ewing, female    DOB: [birth year]  Age: 65 y.o. MRN: [id number]  CC:  Chief Complaint  Patient presents with   Annual Exam    HPI Jenny Ewing presents for physical exam.  She has history of recurrent depression and has been treated for several years with Celexa and depression symptoms are stable.  She went on modified keto type diet over a year ago and has had tremendous weight loss and has been able to maintain this.  She feels well overall.  No formal exercise.  She had recent colonoscopy and had some adenomatous polyps with recommended 3-year follow-up.  Health maintenance reviewed  -Colonoscopy as above with recommended 3-year follow-up -She had Pneumovax 2021 but no history of Prevnar 13 -Previous hepatitis C screen negative -DEXA scan 6/22 showed some osteopenia -Tetanus due 2025 -Mammogram 10/28/2020 -No history of shingles vaccine -Last Pap smear apparently over 5 years ago.  Family history both parents have hypertension.  Her father had atrial fibrillation.  Social history-she is married.  She has 3 daughters.  5 grandchildren.  Non-smoker.  No regular alcohol.  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   in [place]-Normal exam per pt-pt unable to drink all the Goly prep-had vomiting   LAPAROSCOPIC APPENDECTOMY N/A 07/29/2017   Procedure: APPENDECTOMY LAPAROSCOPIC;  Surgeon: Donnie Mesa, MD;  Location: [facility];  Service: General;  Laterality: N/A;   TUBAL LIGATION      Family History  Problem Relation Age of Onset   Cancer Other        colon   Hypertension Other    Stroke Other    Hypertension Mother    Hypertension Father    Colon polyps Paternal  Grandfather    Colon cancer Neg Hx    Esophageal cancer Neg Hx    Rectal cancer Neg Hx    Stomach cancer Neg Hx     Social History   Socioeconomic History   Marital status: Married    Spouse name: Not on file   Number of children: Not on file   Years of education: Not on file   Highest education level: Not on file  Occupational History   Not on file  Tobacco Use   Smoking status: Never   Smokeless tobacco: Never  Vaping Use   Vaping Use: Never used  Substance and Sexual Activity   Alcohol use: Yes    Alcohol/week: 5.0 standard drinks    Types: 5 Standard drinks or equivalent per week   Drug use: Yes    Frequency: 2.0 times per week    Types: Marijuana   Sexual activity: Not on file  Other Topics Concern   Not on file  Social History Narrative   Not on file   Social Determinants of Health   Financial Resource Strain: Low Risk    Difficulty of Paying Living Expenses: Not hard at all  Food Insecurity: No Food Insecurity   Worried About Charity fundraiser in the Last Year: Never true   Ran Out of Food in the Last Year: Never true  Transportation Needs: No Transportation Needs   Lack of Transportation (Medical): No  Lack of Transportation (Non-Medical): No  Physical Activity: Insufficiently Active   Days of Exercise per Week: 1 day   Minutes of Exercise per Session: 10 min  Stress: No Stress Concern Present   Feeling of Stress : Not at all  Social Connections: Moderately Integrated   Frequency of Communication with Friends and Family: More than three times a week   Frequency of Social Gatherings with Friends and Family: More than three times a week   Attends Religious Services: 1 to 4 times per year   Active Member of [employer] or Organizations: No   Attends Archivist Meetings: Never   Marital Status: Married  Human resources officer Violence: Not At Risk   Fear of Current or Ex-Partner: No   Emotionally Abused: No   Physically Abused: No   Sexually Abused:  No    Outpatient Medications Prior to Visit  Medication Sig Dispense Refill   citalopram (CELEXA) 40 MG tablet Take 1 tablet (40 mg total) by mouth daily. 90 tablet 3   levocetirizine (XYZAL) 2.5 MG/5ML solution Take 2.5 mg by mouth every evening.     ondansetron (ZOFRAN) 4 MG tablet Take 1 tablet (4 mg total) by mouth as directed. Take one Zofran pill 30-60 minutes before each colonoscopy prep dose 2 tablet 0   0.9 %  sodium chloride infusion      No facility-administered medications prior to visit.    Allergies  Allergen Reactions   Penicillins     REACTION: Hives    ROS Review of Systems  Constitutional:  Negative for activity change, appetite change, fatigue, fever and unexpected weight change.  HENT:  Negative for ear pain, hearing loss, sore throat and trouble swallowing.   Eyes:  Negative for visual disturbance.  Respiratory:  Negative for cough and shortness of breath.   Cardiovascular:  Negative for chest pain and palpitations.  Gastrointestinal:  Negative for abdominal pain, blood in stool, constipation and diarrhea.  Endocrine: Negative for polydipsia and polyuria.  Genitourinary:  Negative for dysuria and hematuria.  Musculoskeletal:  Negative for arthralgias, back pain and myalgias.  Skin:  Negative for rash.  Neurological:  Negative for dizziness, syncope and headaches.  Hematological:  Negative for adenopathy.  Psychiatric/Behavioral:  Negative for confusion and dysphoric mood.      Objective:    Physical Exam Constitutional:      Appearance: She is well-developed.  HENT:     Head: Normocephalic and atraumatic.  Eyes:     Pupils: Pupils are equal, round, and reactive to light.  Neck:     Thyroid: No thyromegaly.  Cardiovascular:     Rate and Rhythm: Normal rate and regular rhythm.     Heart sounds: Normal heart sounds. No murmur heard. Pulmonary:     Effort: No respiratory distress.     Breath sounds: Normal breath sounds. No wheezing or rales.   Abdominal:     General: Bowel sounds are normal. There is no distension.     Palpations: Abdomen is soft. There is no mass.     Tenderness: There is no abdominal tenderness. There is no guarding or rebound.  Musculoskeletal:        General: Normal range of motion.     Cervical back: Normal range of motion and neck supple.     Right lower leg: No edema.     Left lower leg: No edema.  Lymphadenopathy:     Cervical: No cervical adenopathy.  Skin:    Findings: No rash.  Neurological:     Mental Status: She is alert and oriented to person, place, and time.     Cranial Nerves: No cranial nerve deficit.  Psychiatric:        Behavior: Behavior normal.        Thought Content: Thought content normal.        Judgment: Judgment normal.    BP 124/80   Pulse 90   Temp 98.1 F (36.7 C) (Oral)   Ht '5\' 5"'$  (1.651 m)   Wt 137 lb 4 oz (62.3 kg)   SpO2 98%   BMI 22.84 kg/m  Wt Readings from Last 3 Encounters:  12/02/20 137 lb 4 oz (62.3 kg)  09/09/20 145 lb (65.8 kg)  08/26/20 145 lb (65.8 kg)     Health Maintenance Due  Topic Date Due   COVID-19 Vaccine (1) Never done   Zoster Vaccines- Shingrix (1 of 2) Never done   INFLUENZA VACCINE  Never done    There are no preventive care reminders to display for this patient.  Lab Results  Component Value Date   TSH 3.06 05/06/2019   Lab Results  Component Value Date   WBC 5.2 05/06/2019   HGB 14.0 05/06/2019   HCT 42.8 05/06/2019   MCV 96.0 05/06/2019   PLT 252.0 05/06/2019   Lab Results  Component Value Date   NA 139 05/06/2019   K 4.9 05/06/2019   CO2 31 05/06/2019   GLUCOSE 92 05/06/2019   BUN 22 05/06/2019   CREATININE 0.86 05/06/2019   BILITOT 0.4 05/06/2019   ALKPHOS 63 05/06/2019   AST 16 05/06/2019   ALT 10 05/06/2019   PROT 6.4 05/06/2019   ALBUMIN 4.4 05/06/2019   CALCIUM 9.4 05/06/2019   ANIONGAP 19 (H) 07/28/2017   GFR 66.16 05/06/2019   Lab Results  Component Value Date   CHOL 220 (H) 05/06/2019    Lab Results  Component Value Date   HDL 93.80 05/06/2019   Lab Results  Component Value Date   LDLCALC 115 (H) 05/06/2019   Lab Results  Component Value Date   TRIG 58.0 05/06/2019   Lab Results  Component Value Date   CHOLHDL 2 05/06/2019   No results found for: HGBA1C    Assessment & Plan:   Problem List Items Addressed This Visit   None Visit Diagnoses     Physical exam    -  Primary   Need for pneumococcal vaccination       Relevant Orders   Pneumococcal conjugate vaccine 13-valent (Completed)     We discussed the following health maintenance items  -Prevnar 13 given -Discussed annual flu vaccine but she declines -Discussed Shingrix vaccine.  She will check on insurance coverage. -We did recommend she consider repeat Pap smear but she declines at this time.  She will reconsider next year. -Repeat colonoscopy in 3 years -Discussed importance of regular weightbearing exercise along with regular daily calcium and vitamin D supplementation  Meds ordered this encounter  Medications   citalopram (CELEXA) 40 MG tablet    Sig: Take 1 tablet (40 mg total) by mouth daily.    Dispense:  90 tablet    Refill:  3    Follow-up: No follow-ups on file.    Carolann Littler, MD

## 2020-12-02 NOTE — Patient Instructions (Signed)
Consider Shingrix vaccine and check on insurance coverage if interested.   We do need to consider pap smear by next year.

## 2020-12-04 ENCOUNTER — Encounter: Payer: Self-pay | Admitting: Family Medicine

## 2021-01-06 ENCOUNTER — Encounter: Payer: Medicare Other | Admitting: Family Medicine

## 2021-05-10 DIAGNOSIS — Z23 Encounter for immunization: Secondary | ICD-10-CM | POA: Diagnosis not present

## 2021-05-10 DIAGNOSIS — L409 Psoriasis, unspecified: Secondary | ICD-10-CM | POA: Diagnosis not present

## 2021-07-30 DIAGNOSIS — J020 Streptococcal pharyngitis: Secondary | ICD-10-CM | POA: Diagnosis not present

## 2021-07-31 ENCOUNTER — Telehealth: Payer: Self-pay | Admitting: Family Medicine

## 2021-07-31 NOTE — Telephone Encounter (Signed)
Spoke with patient to schedule Medicare Annual Wellness Visit (AWV) either virtually or in office.  ? ?Pt has strep throat and wanted a call back in a couple weeks ? ?Last AWV ;07/29/20 ?please schedule at anytime with [facility] Nurse Health Advisor 1 or 2 ? ? ? ?

## 2021-08-21 ENCOUNTER — Encounter: Payer: Self-pay | Admitting: Family Medicine

## 2021-08-21 ENCOUNTER — Ambulatory Visit (INDEPENDENT_AMBULATORY_CARE_PROVIDER_SITE_OTHER): Payer: Medicare Other | Admitting: Family Medicine

## 2021-08-21 VITALS — BP 130/80 | HR 82 | Temp 98.1°F | Ht 65.0 in | Wt 136.1 lb

## 2021-08-21 DIAGNOSIS — M79671 Pain in right foot: Secondary | ICD-10-CM

## 2021-08-21 NOTE — Progress Notes (Signed)
Established Patient Office Visit  Subjective   Patient ID: Jenny Ewing, female    DOB: 04/16/1955  Age: 66 y.o. MRN: [id number]  Chief Complaint  Patient presents with   Foot Injury    Patient complains of foot injury, x2 weeks, Patient had fall and injured foot while at music festival,     HPI   Right distal foot pain.  Started about 2 weeks ago.  She was at a music festival and walking down a dirt road with an uneven surface when she injured the foot.  She stepped on an uneven surface and felt a pain across her distal lateral right foot.  She had some swelling and bruising afterwards.  She is not having much difficulty with weightbearing at this time.  Pain is actually improving some.  No ankle injury.  No Achilles pain.  Use some compression and icing with some benefit.  Past Medical History:  Diagnosis Date   Allergy    DEPRESSION 01/27/2009   Past Surgical History:  Procedure Laterality Date   APPENDECTOMY  07/29/2017   LAPROSCOPIC   COLONOSCOPY  2008?   in [place]-Normal exam per pt-pt unable to drink all the Goly prep-had vomiting   LAPAROSCOPIC APPENDECTOMY N/A 07/29/2017   Procedure: APPENDECTOMY LAPAROSCOPIC;  Surgeon: Donnie Mesa, MD;  Location: [facility];  Service: General;  Laterality: N/A;   TUBAL LIGATION      reports that she has never smoked. She has never used smokeless tobacco. She reports current alcohol use of about 5.0 standard drinks per week. She reports current drug use. Frequency: 2.00 times per week. Drug: Marijuana. family history includes Cancer in an other family member; Colon polyps in her paternal grandfather; Hypertension in her father, mother, and another family member; Stroke in an other family member. Allergies  Allergen Reactions   Penicillins     REACTION: Hives    ROS  No problem with ambulation.  No weakness.   Objective:     BP 130/80 (BP Location: Left Arm, Patient Position: Sitting, Cuff Size: Normal)   Pulse 82   Temp  98.1 F (36.7 C) (Oral)   Ht '5\' 5"'$  (1.651 m)   Wt 136 lb 1.6 oz (61.7 kg)   SpO2 98%   BMI 22.65 kg/m    Physical Exam Vitals reviewed.  Constitutional:      Appearance: Normal appearance.  Cardiovascular:     Rate and Rhythm: Normal rate and regular rhythm.  Musculoskeletal:     Comments: Right foot reveals no visible bruising at this time.  Mild swelling compared to the left.  She has some mild tenderness to palpation on the distal right fifth metatarsal.  No proximal metatarsal tenderness.  No Achilles tenderness.  Full range of motion ankle.  Neurological:     Mental Status: She is alert.     No results found for any visits on 08/21/21.    The 10-year ASCVD risk score (Arnett DK, et al., 2019) is: 6.2%    Assessment & Plan:   Problem List Items Addressed This Visit   None Visit Diagnoses     Right foot pain    -  Primary   Relevant Orders   DG Foot Complete Right     Right foot injury a little over 2 weeks ago.  Rule out distal fifth metatarsal fracture.  Your x-ray had already closed down today and she will return tomorrow for x-ray.  We discussed wearing a shoe with firmer sole in the  meantime until we clarify.  No follow-ups on file.    Carolann Littler, MD

## 2021-08-21 NOTE — Patient Instructions (Signed)
X-ray has been ordered and try to get by tomorrow to get the X-ray

## 2021-08-22 ENCOUNTER — Ambulatory Visit (INDEPENDENT_AMBULATORY_CARE_PROVIDER_SITE_OTHER): Payer: Medicare Other

## 2021-08-22 ENCOUNTER — Other Ambulatory Visit: Payer: Medicare Other

## 2021-08-22 DIAGNOSIS — M79671 Pain in right foot: Secondary | ICD-10-CM | POA: Diagnosis not present

## 2021-08-22 DIAGNOSIS — M79672 Pain in left foot: Secondary | ICD-10-CM | POA: Diagnosis not present

## 2021-08-23 ENCOUNTER — Encounter: Payer: Self-pay | Admitting: Family Medicine

## 2021-08-23 NOTE — Telephone Encounter (Signed)
I reviewed X-ray with patient.  Oblique fracture through mid to distal shaft of 5th metatarsal with no significant displacement.  Injury was 3 weeks ago.   Her pain is improving.   Did suggest post-op wooden shoe to limit pressure on this for another 3-4 weeks.    Consider follow up x-ray 4 weeks to document healing.   Follow up sooner for increased pain or other concern.

## 2021-09-12 ENCOUNTER — Ambulatory Visit (INDEPENDENT_AMBULATORY_CARE_PROVIDER_SITE_OTHER): Payer: Medicare Other

## 2021-09-12 VITALS — Ht 65.0 in | Wt 136.0 lb

## 2021-09-12 DIAGNOSIS — Z0000 Encounter for general adult medical examination without abnormal findings: Secondary | ICD-10-CM | POA: Diagnosis not present

## 2021-09-12 NOTE — Progress Notes (Signed)
I connected with Jenny Ewing today by telephone and verified that I am speaking with the correct person using two identifiers. Location patient: home Location provider: work Persons participating in the virtual visit: Daniell, [place] LPN.   I discussed the limitations, risks, security and privacy concerns of performing an evaluation and management service by telephone and the availability of in person appointments. I also discussed with the patient that there may be a patient responsible charge related to this service. The patient expressed understanding and verbally consented to this telephonic visit.    Interactive audio and video telecommunications were attempted between this provider and patient, however failed, due to patient having technical difficulties OR patient did not have access to video capability.  We continued and completed visit with audio only.     Vital signs may be patient reported or missing.  Subjective:   Jenny Ewing is a 66 y.o. female who presents for Medicare Annual (Subsequent) preventive examination.  Review of Systems     Cardiac Risk Factors include: advanced age (>22mn, >>58women)     Objective:    Today's Vitals   09/12/21 1556  Weight: 136 lb (61.7 kg)  Height: '5\' 5"'$  (16.812m)   Body mass index is 22.63 kg/m.     09/12/2021    3:59 PM 07/29/2020    1:26 PM 07/28/2017   11:30 PM  Advanced Directives  Does Patient Have a Medical Advance Directive? No Yes No  Type of ACorporate treasurerof AM[place]Living will   Does patient want to make changes to medical advance directive?  No - Patient declined   Copy of [place]?  No - copy requested   Would patient like information on creating a medical advance directive?   No - Patient declined    Current Medications (verified) Outpatient Encounter Medications as of 09/12/2021  Medication Sig   citalopram (CELEXA) 40 MG tablet Take 1 tablet (40  mg total) by mouth daily.   No facility-administered encounter medications on file as of 09/12/2021.    Allergies (verified) Penicillins   History: Past Medical History:  Diagnosis Date   Allergy    DEPRESSION 01/27/2009   Past Surgical History:  Procedure Laterality Date   APPENDECTOMY  07/29/2017   LAPROSCOPIC   COLONOSCOPY  2008?   in [place]-Normal exam per pt-pt unable to drink all the Goly prep-had vomiting   LAPAROSCOPIC APPENDECTOMY N/A 07/29/2017   Procedure: APPENDECTOMY LAPAROSCOPIC;  Surgeon: TDonnie Mesa MD;  Location: [facility]  Service: General;  Laterality: N/A;   TUBAL LIGATION     Family History  Problem Relation Age of Onset   Hypertension Mother    Hypertension Father    Colon polyps Paternal Grandfather    Cancer Other        colon   Hypertension Other    Stroke Other    Colon cancer Neg Hx    Esophageal cancer Neg Hx    Rectal cancer Neg Hx    Stomach cancer Neg Hx    Social History   Socioeconomic History   Marital status: Married    Spouse name: Not on file   Number of children: Not on file   Years of education: Not on file   Highest education level: Bachelor's degree (e.g., BA, AB, BS)  Occupational History   Not on file  Tobacco Use   Smoking status: Never   Smokeless tobacco: Never  Vaping Use  Vaping Use: Never used  Substance and Sexual Activity   Alcohol use: Yes    Alcohol/week: 5.0 standard drinks of alcohol    Types: 5 Standard drinks or equivalent per week   Drug use: Not Currently    Frequency: 2.0 times per week    Types: Marijuana   Sexual activity: Not on file  Other Topics Concern   Not on file  Social History Narrative   Not on file   Social Determinants of Health   Financial Resource Strain: Low Risk  (09/12/2021)   Overall Financial Resource Strain (CARDIA)    Difficulty of Paying Living Expenses: Not hard at all  Food Insecurity: No Food Insecurity (09/12/2021)   Hunger Vital Sign    Worried About  Running Out of Food in the Last Year: Never true    Ran Out of Food in the Last Year: Never true  Transportation Needs: No Transportation Needs (09/12/2021)   PRAPARE - Hydrologist (Medical): No    Lack of Transportation (Non-Medical): No  Physical Activity: Sufficiently Active (09/12/2021)   Exercise Vital Sign    Days of Exercise per Week: 3 days    Minutes of Exercise per Session: 60 min  Recent Concern: Physical Activity - Insufficiently Active (08/17/2021)   Exercise Vital Sign    Days of Exercise per Week: 3 days    Minutes of Exercise per Session: 30 min  Stress: No Stress Concern Present (09/12/2021)   [place]    Feeling of Stress : Not at all  Social Connections: Moderately Integrated (08/17/2021)   Social Connection and Isolation Panel [NHANES]    Frequency of Communication with Friends and Family: More than three times a week    Frequency of Social Gatherings with Friends and Family: Three times a week    Attends Religious Services: 1 to 4 times per year    Active Member of Clubs or Organizations: No    Attends Music therapist: Not on file    Marital Status: Married    Tobacco Counseling Counseling given: Not Answered   Clinical Intake:  Pre-visit preparation completed: Yes  Pain : No/denies pain     Nutritional Status: BMI of 19-24  Normal Nutritional Risks: None Diabetes: No  How often do you need to have someone help you when you read instructions, pamphlets, or other written materials from your doctor or pharmacy?: 1 - Never What is the last grade level you completed in school?: bachelors degree  Diabetic? no  Interpreter Needed?: No  Information entered by :: NAllen LPN   Activities of Daily Living    09/12/2021    4:01 PM  In your present state of health, do you have any difficulty performing the following activities:  Hearing? 0   Vision? 0  Difficulty concentrating or making decisions? 0  Walking or climbing stairs? 0  Dressing or bathing? 0  Doing errands, shopping? 0  Preparing Food and eating ? N  Using the Toilet? N  In the past six months, have you accidently leaked urine? Y  Do you have problems with loss of bowel control? N  Managing your Medications? N  Managing your Finances? N  Housekeeping or managing your Housekeeping? N    Patient Care Team: Eulas Post, MD as PCP - General  Indicate any recent Medical Services you may have received from other than [facility] providers in the past year (date may be  approximate).     Assessment:   This is a routine wellness examination for Taziah.  Hearing/Vision screen Vision Screening - Comments:: Regular eye exams, [facility]  Dietary issues and exercise activities discussed: Current Exercise Habits: Home exercise routine, Type of exercise: walking, Time (Minutes): 60, Frequency (Times/Week): 3, Weekly Exercise (Minutes/Week): 180   Goals Addressed             This Visit's Progress    Patient Stated       09/12/2021, no goals       Depression Screen    09/12/2021    4:00 PM 08/21/2021    4:35 PM 07/29/2020    1:32 PM 07/29/2020    1:30 PM 05/06/2019    7:00 AM  PHQ 2/9 Scores  PHQ - 2 Score 0 0 0 0 0  PHQ- 9 Score  1   0    Fall Risk    09/12/2021    3:59 PM 08/21/2021    4:35 PM 08/17/2021    8:42 AM 07/29/2020    1:31 PM 05/06/2019    7:00 AM  Fall Risk   Falls in the past year? '1 1 1 '$ 0 0  Comment twisted ankle      Number falls in past yr: 1 0 0 0 0  Injury with Fall? '1 1 1 '$ 0 0  Comment fractured foot      Risk for fall due to : Medication side effect No Fall Risks     Follow up Falls evaluation completed;Education provided;Falls prevention discussed Falls evaluation completed       FALL RISK PREVENTION PERTAINING TO THE HOME:  Any stairs in or around the home? No  If so, are there any without handrails?  N/a Home free of  loose throw rugs in walkways, pet beds, electrical cords, etc? Yes  Adequate lighting in your home to reduce risk of falls? Yes   ASSISTIVE DEVICES UTILIZED TO PREVENT FALLS:  Life alert? No  Use of a cane, walker or w/c? No  Grab bars in the bathroom? Yes  Shower chair or bench in shower? No  Elevated toilet seat or a handicapped toilet? No   TIMED UP AND GO:  Was the test performed? No .      Cognitive Function:        09/12/2021    4:02 PM  6CIT Screen  What Year? 0 points  What month? 0 points  What time? 0 points  Count back from 20 0 points  Months in reverse 0 points  Repeat phrase 4 points  Total Score 4 points    Immunizations Immunization History  Administered Date(s) Administered   Pneumococcal Conjugate-13 12/02/2020   Pneumococcal Polysaccharide-23 05/06/2019   Tdap 06/10/2013    TDAP status: Up to date  Flu Vaccine status: Declined, Education has been provided regarding the importance of this vaccine but patient still declined. Advised may receive this vaccine at local pharmacy or Health Dept. Aware to provide a copy of the vaccination record if obtained from local pharmacy or Health Dept. Verbalized acceptance and understanding.  Pneumococcal vaccine status: Up to date  Covid-19 vaccine status: Declined, Education has been provided regarding the importance of this vaccine but patient still declined. Advised may receive this vaccine at local pharmacy or Health Dept.or vaccine clinic. Aware to provide a copy of the vaccination record if obtained from local pharmacy or Health Dept. Verbalized acceptance and understanding.  Qualifies for Shingles Vaccine? Yes   Zostavax  completed No   Shingrix Completed?: No.    Education has been provided regarding the importance of this vaccine. Patient has been advised to call insurance company to determine out of pocket expense if they have not yet received this vaccine. Advised may also receive vaccine at local  pharmacy or Health Dept. Verbalized acceptance and understanding.  Screening Tests Health Maintenance  Topic Date Due   COVID-19 Vaccine (1) 09/28/2021 (Originally 07/21/1956)   Zoster Vaccines- Shingrix (1 of 2) 12/13/2021 (Originally 01/20/2004)   INFLUENZA VACCINE  10/31/2021   MAMMOGRAM  10/29/2022   TETANUS/TDAP  06/11/2023   COLONOSCOPY (Pts 45-75yr Insurance coverage will need to be confirmed)  09/10/2023   Pneumonia Vaccine 68 Years old  Completed   DEXA SCAN  Completed   Hepatitis C Screening  Completed   HPV VACCINES  Aged Out    Health Maintenance  There are no preventive care reminders to display for this patient.   Colorectal cancer screening: Type of screening: Colonoscopy. Completed 09/09/2020. Repeat every 3 years  Mammogram status: Completed 10/28/2020. Repeat every year  Bone Density status: Completed 09/02/2020.   Lung Cancer Screening: (Low Dose CT Chest recommended if Age 68-80years, 30 pack-year currently smoking OR have quit w/in 15years.) does not qualify.   Lung Cancer Screening Referral: no  Additional Screening:  Hepatitis C Screening: does qualify; Completed 04/22/2017  Vision Screening: Recommended annual ophthalmology exams for early detection of glaucoma and other disorders of the eye. Is the patient up to date with their annual eye exam?  Yes  Who is the provider or what is the name of the office in which the patient attends annual eye exams? [facility] If pt is not established with a provider, would they like to be referred to a provider to establish care? No .   Dental Screening: Recommended annual dental exams for proper oral hygiene  Community Resource Referral / Chronic Care Management: CRR required this visit?  No   CCM required this visit?  No      Plan:     I have personally reviewed and noted the following in the patient's chart:   Medical and social history Use of alcohol, tobacco or illicit drugs  Current medications  and supplements including opioid prescriptions.  Functional ability and status Nutritional status Physical activity Advanced directives List of other physicians Hospitalizations, surgeries, and ER visits in previous 12 months Vitals Screenings to include cognitive, depression, and falls Referrals and appointments  In addition, I have reviewed and discussed with patient certain preventive protocols, quality metrics, and best practice recommendations. A written personalized care plan for preventive services as well as general preventive health recommendations were provided to patient.     NKellie Simmering LPN   [id number]  Nurse Notes: none  Due to this being a virtual visit, the after visit summary with patients personalized plan was offered to patient via mail or my-chart.  Patient would like to access on my-chart

## 2021-09-12 NOTE — Patient Instructions (Signed)
Jenny Ewing , Thank you for taking time to come for your Medicare Wellness Visit. I appreciate your ongoing commitment to your health goals. Please review the following plan we discussed and let me know if I can assist you in the future.   Screening recommendations/referrals: Colonoscopy: completed 09/09/2020, due 09/10/2023 Mammogram: completed 10/28/2020, due 10/29/2021 Bone Density: completed 09/02/2020 Recommended yearly ophthalmology/optometry visit for glaucoma screening and checkup Recommended yearly dental visit for hygiene and checkup  Vaccinations: Influenza vaccine: decline Pneumococcal vaccine: completed 12/02/2020 Tdap vaccine: completed 06/10/2013, due 06/11/2023 Shingles vaccine: discussed   Covid-19: decline  Advanced directives: Advance directive discussed with you today.   Conditions/risks identified: none  Next appointment: Follow up in one year for your annual wellness visit    Preventive Care 65 Years and Older, Female Preventive care refers to lifestyle choices and visits with your health care provider that can promote health and wellness. What does preventive care include? A yearly physical exam. This is also called an annual well check. Dental exams once or twice a year. Routine eye exams. Ask your health care provider how often you should have your eyes checked. Personal lifestyle choices, including: Daily care of your teeth and gums. Regular physical activity. Eating a healthy diet. Avoiding tobacco and drug use. Limiting alcohol use. Practicing safe sex. Taking low-dose aspirin every day. Taking vitamin and mineral supplements as recommended by your health care provider. What happens during an annual well check? The services and screenings done by your health care provider during your annual well check will depend on your age, overall health, lifestyle risk factors, and family history of disease. Counseling  Your health care provider may ask you questions about  your: Alcohol use. Tobacco use. Drug use. Emotional well-being. Home and relationship well-being. Sexual activity. Eating habits. History of falls. Memory and ability to understand (cognition). Work and work Statistician. Reproductive health. Screening  You may have the following tests or measurements: Height, weight, and BMI. Blood pressure. Lipid and cholesterol levels. These may be checked every 5 years, or more frequently if you are over 54 years old. Skin check. Lung cancer screening. You may have this screening every year starting at age 5 if you have a 30-pack-year history of smoking and currently smoke or have quit within the past 15 years. Fecal occult blood test (FOBT) of the stool. You may have this test every year starting at age 12. Flexible sigmoidoscopy or colonoscopy. You may have a sigmoidoscopy every 5 years or a colonoscopy every 10 years starting at age 63. Hepatitis C blood test. Hepatitis B blood test. Sexually transmitted disease (STD) testing. Diabetes screening. This is done by checking your blood sugar (glucose) after you have not eaten for a while (fasting). You may have this done every 1-3 years. Bone density scan. This is done to screen for osteoporosis. You may have this done starting at age 78. Mammogram. This may be done every 1-2 years. Talk to your health care provider about how often you should have regular mammograms. Talk with your health care provider about your test results, treatment options, and if necessary, the need for more tests. Vaccines  Your health care provider may recommend certain vaccines, such as: Influenza vaccine. This is recommended every year. Tetanus, diphtheria, and acellular pertussis (Tdap, Td) vaccine. You may need a Td booster every 10 years. Zoster vaccine. You may need this after age 55. Pneumococcal 13-valent conjugate (PCV13) vaccine. One dose is recommended after age 62. Pneumococcal polysaccharide (PPSV23) vaccine.  One dose is recommended after age 55. Talk to your health care provider about which screenings and vaccines you need and how often you need them. This information is not intended to replace advice given to you by your health care provider. Make sure you discuss any questions you have with your health care provider. Document Released: 04/15/2015 Document Revised: 12/07/2015 Document Reviewed: 01/18/2015 Elsevier Interactive Patient Education  2017 [employer] Prevention in the Home Falls can cause injuries. They can happen to people of all ages. There are many things you can do to make your home safe and to help prevent falls. What can I do on the outside of my home? Regularly fix the edges of walkways and driveways and fix any cracks. Remove anything that might make you trip as you walk through a door, such as a raised step or threshold. Trim any bushes or trees on the path to your home. Use bright outdoor lighting. Clear any walking paths of anything that might make someone trip, such as rocks or tools. Regularly check to see if handrails are loose or broken. Make sure that both sides of any steps have handrails. Any raised decks and porches should have guardrails on the edges. Have any leaves, snow, or ice cleared regularly. Use sand or salt on walking paths during winter. Clean up any spills in your garage right away. This includes oil or grease spills. What can I do in the bathroom? Use night lights. Install grab bars by the toilet and in the tub and shower. Do not use towel bars as grab bars. Use non-skid mats or decals in the tub or shower. If you need to sit down in the shower, use a plastic, non-slip stool. Keep the floor dry. Clean up any water that spills on the floor as soon as it happens. Remove soap buildup in the tub or shower regularly. Attach bath mats securely with double-sided non-slip rug tape. Do not have throw rugs and other things on the floor that can make  you trip. What can I do in the bedroom? Use night lights. Make sure that you have a light by your bed that is easy to reach. Do not use any sheets or blankets that are too big for your bed. They should not hang down onto the floor. Have a firm chair that has side arms. You can use this for support while you get dressed. Do not have throw rugs and other things on the floor that can make you trip. What can I do in the kitchen? Clean up any spills right away. Avoid walking on wet floors. Keep items that you use a lot in easy-to-reach places. If you need to reach something above you, use a strong step stool that has a grab bar. Keep electrical cords out of the way. Do not use floor polish or wax that makes floors slippery. If you must use wax, use non-skid floor wax. Do not have throw rugs and other things on the floor that can make you trip. What can I do with my stairs? Do not leave any items on the stairs. Make sure that there are handrails on both sides of the stairs and use them. Fix handrails that are broken or loose. Make sure that handrails are as long as the stairways. Check any carpeting to make sure that it is firmly attached to the stairs. Fix any carpet that is loose or worn. Avoid having throw rugs at the top or bottom of the  stairs. If you do have throw rugs, attach them to the floor with carpet tape. Make sure that you have a light switch at the top of the stairs and the bottom of the stairs. If you do not have them, ask someone to add them for you. What else can I do to help prevent falls? Wear shoes that: Do not have high heels. Have rubber bottoms. Are comfortable and fit you well. Are closed at the toe. Do not wear sandals. If you use a stepladder: Make sure that it is fully opened. Do not climb a closed stepladder. Make sure that both sides of the stepladder are locked into place. Ask someone to hold it for you, if possible. Clearly mark and make sure that you can  see: Any grab bars or handrails. First and last steps. Where the edge of each step is. Use tools that help you move around (mobility aids) if they are needed. These include: Canes. Walkers. Scooters. Crutches. Turn on the lights when you go into a dark area. Replace any light bulbs as soon as they burn out. Set up your furniture so you have a clear path. Avoid moving your furniture around. If any of your floors are uneven, fix them. If there are any pets around you, be aware of where they are. Review your medicines with your doctor. Some medicines can make you feel dizzy. This can increase your chance of falling. Ask your doctor what other things that you can do to help prevent falls. This information is not intended to replace advice given to you by your health care provider. Make sure you discuss any questions you have with your health care provider. Document Released: 01/13/2009 Document Revised: 08/25/2015 Document Reviewed: 04/23/2014 Elsevier Interactive Patient Education  2017 [employer].

## 2021-09-14 ENCOUNTER — Other Ambulatory Visit: Payer: Self-pay | Admitting: Family Medicine

## 2021-09-14 DIAGNOSIS — Z1231 Encounter for screening mammogram for malignant neoplasm of breast: Secondary | ICD-10-CM

## 2021-11-03 ENCOUNTER — Ambulatory Visit
Admission: RE | Admit: 2021-11-03 | Discharge: 2021-11-03 | Disposition: A | Discharge status: Home / Self Care | Payer: Medicare Other | Source: Ambulatory Visit | Attending: Family Medicine | Admitting: Family Medicine

## 2021-11-03 DIAGNOSIS — Z1231 Encounter for screening mammogram for malignant neoplasm of breast: Secondary | ICD-10-CM | POA: Diagnosis not present

## 2022-02-01 ENCOUNTER — Other Ambulatory Visit: Payer: Self-pay | Admitting: Family Medicine

## 2022-02-09 ENCOUNTER — Ambulatory Visit (INDEPENDENT_AMBULATORY_CARE_PROVIDER_SITE_OTHER): Payer: Medicare Other | Admitting: Family Medicine

## 2022-02-09 ENCOUNTER — Encounter: Payer: Self-pay | Admitting: Family Medicine

## 2022-02-09 VITALS — BP 154/84 | HR 75 | Temp 97.8°F | Ht 65.0 in | Wt 142.1 lb

## 2022-02-09 DIAGNOSIS — Z0000 Encounter for general adult medical examination without abnormal findings: Secondary | ICD-10-CM | POA: Diagnosis not present

## 2022-02-09 NOTE — Patient Instructions (Addendum)
Monitor blood pressure and be in touch if consistently > 140/90.     Try to get daily Vit D 1,000 IU and Calcium 1,200 mg .

## 2022-02-09 NOTE — Progress Notes (Signed)
Established Patient Office Visit  Subjective   Patient ID: Jenny Ewing, female    DOB: [birth year]  Age: 66 y.o. MRN: [id number]  Chief Complaint  Patient presents with   Annual Exam    HPI   Jenny Ewing is seen for physical exam.  She has history of recurrent depression stable on Celexa 40 mg daily.  No other active medical problems.  She started keto type diet a few years ago and has done a good job maintaining her weight.  She has relaxed her diet somewhat.  She has history of osteopenia by DEXA scan 6/22.  Does not take any regular calcium or vitamin D supplementation.  Does some hiking.  Health maintenance reviewed  -Pneumonia vaccines complete -Prior hepatitis C screen negative -Mammogram in August was normal -Colonoscopy due 2025 -Tetanus due 2025 -No history of Shingrix vaccine and she declines at this time.  Family history both parents have hypertension.  Her father had atrial fibrillation.   Social history-she is married.  She has 3 daughters.  5 grandchildren.  Non-smoker.  No regular alcohol.  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   in [place]-Normal exam per pt-pt unable to drink all the Goly prep-had vomiting   LAPAROSCOPIC APPENDECTOMY N/A 07/29/2017   Procedure: APPENDECTOMY LAPAROSCOPIC;  Surgeon: Donnie Mesa, MD;  Location: [facility];  Service: General;  Laterality: N/A;   TUBAL LIGATION      reports that she has never smoked. She has never used smokeless tobacco. She reports current alcohol use of about 5.0 standard drinks of alcohol per week. She reports that she does not currently use drugs after having used the following drugs: Marijuana. Frequency: 2.00 times per week. family history includes Cancer in an other  family member; Colon polyps in her paternal grandfather; Hypertension in her father, mother, and another family member; Stroke in an other family member. Allergies  Allergen Reactions   Penicillins     REACTION: Hives     Review of Systems  Constitutional:  Negative for chills, fever, malaise/fatigue and weight loss.  HENT:  Negative for hearing loss.   Eyes:  Negative for blurred vision and double vision.  Respiratory:  Negative for cough and shortness of breath.   Cardiovascular:  Negative for chest pain, palpitations and leg swelling.  Gastrointestinal:  Negative for abdominal pain, blood in stool, constipation and diarrhea.  Genitourinary:  Negative for dysuria.  Skin:  Negative for rash.  Neurological:  Negative for dizziness, speech change, seizures, loss of consciousness and headaches.  Psychiatric/Behavioral:  Negative for depression.       Objective:     BP (!) 154/84 (BP Location: Left Arm, Cuff Size: Normal)   Pulse 75   Temp 97.8 F (36.6 C) (Oral)   Ht '5\' 5"'$  (1.651 m)   Wt 142 lb 1.6 oz (64.5 kg)   SpO2 98%   BMI 23.65 kg/m  BP Readings from Last 3 Encounters:  02/09/22 (!) 154/84  08/21/21 130/80  12/02/20 124/80   Wt Readings from Last 3 Encounters:  02/09/22 142 lb 1.6 oz (64.5 kg)  09/12/21 136 lb (61.7 kg)  08/21/21 136 lb 1.6 oz (61.7 kg)      Physical Exam Constitutional:  Appearance: She is well-developed.  HENT:     Head: Normocephalic and atraumatic.  Eyes:     Pupils: Pupils are equal, round, and reactive to light.  Neck:     Thyroid: No thyromegaly.  Cardiovascular:     Rate and Rhythm: Normal rate and regular rhythm.     Heart sounds: Normal heart sounds. No murmur heard. Pulmonary:     Effort: No respiratory distress.     Breath sounds: Normal breath sounds. No wheezing or rales.  Abdominal:     General: Bowel sounds are normal. There is no distension.     Palpations: Abdomen is soft. There is no mass.     Tenderness:  There is no abdominal tenderness. There is no guarding or rebound.  Musculoskeletal:        General: Normal range of motion.     Cervical back: Normal range of motion and neck supple.  Lymphadenopathy:     Cervical: No cervical adenopathy.  Skin:    Findings: No rash.  Neurological:     Mental Status: She is alert and oriented to person, place, and time.     Cranial Nerves: No cranial nerve deficit.  Psychiatric:        Behavior: Behavior normal.        Thought Content: Thought content normal.        Judgment: Judgment normal.      No results found for any visits on 02/09/22.    The 10-year ASCVD risk score (Arnett DK, et al., 2019) is: 9.7%    Assessment & Plan:   Physical exam.  She has history of recurrent depression which is currently stable on Celexa.  She does have elevated blood pressure today without prior history of hypertension.  She has osteopenia by DEXA scan little over a year ago.  We suggested several things as follows  -Offered flu vaccine but she declines -Discussed Shingrix vaccine and she will consider after checking with insurance further -Suggested daily vitamin D at least 800 international units and calcium 1200 mg -Continue regular weightbearing exercise -Recommend home monitoring of blood pressure closely next few weeks.  Be in touch if consistently greater than 503 systolic. -Handout on DASH diet given -Try to establish more consistent exercise -Continue with at least every other year mammogram -Consider repeat DEXA scan by the summer  No follow-ups on file.    Carolann Littler, MD

## 2022-02-13 ENCOUNTER — Encounter: Payer: Self-pay | Admitting: Family Medicine

## 2022-05-02 ENCOUNTER — Other Ambulatory Visit: Payer: Self-pay | Admitting: Family Medicine

## 2022-07-13 DIAGNOSIS — L298 Other pruritus: Secondary | ICD-10-CM | POA: Diagnosis not present

## 2022-07-13 DIAGNOSIS — L218 Other seborrheic dermatitis: Secondary | ICD-10-CM | POA: Diagnosis not present

## 2022-08-26 ENCOUNTER — Encounter: Payer: Medicare Other | Admitting: Nurse Practitioner

## 2022-08-26 ENCOUNTER — Telehealth: Payer: Medicare Other | Admitting: Nurse Practitioner

## 2022-08-26 DIAGNOSIS — L237 Allergic contact dermatitis due to plants, except food: Secondary | ICD-10-CM

## 2022-08-26 MED ORDER — PREDNISONE 10 MG PO TABS: ORAL_TABLET | ORAL | 0 refills | Status: DC

## 2022-08-26 NOTE — Progress Notes (Signed)
Duplicate visit. Patient did not log back into this virtual slot but opened another visit and logged in for alternate timeframe

## 2022-08-26 NOTE — Patient Instructions (Signed)
  Verlin Fester, thank you for joining Claiborne Rigg, NP for today's virtual visit.  While this provider is not your primary care provider (PCP), if your PCP is located in our provider database this encounter information will be shared with them immediately following your visit.   A [facility] MyChart account gives you access to today's visit and all your visits, tests, and labs performed at [facility] " click here if you don't have a [facility] MyChart account or go to [email or web address]  Consent: (Patient) Jenny Ewing provided verbal consent for this virtual visit at the beginning of the encounter.  Current Medications:  Current Outpatient Medications:    citalopram (CELEXA) 40 MG tablet, TAKE 1 TABLET BY MOUTH DAILY, Disp: 90 tablet, Rfl: 1   Medications ordered in this encounter:  No orders of the defined types were placed in this encounter.    *If you need refills on other medications prior to your next appointment, please contact your pharmacy*  Follow-Up: Call back or seek an in-person evaluation if the symptoms worsen or if the condition fails to improve as anticipated.  [facility] [phone]  Other Instructions Continue calamine lotion otc. Oatmeal baths for comfort.   If you have been instructed to have an in-person evaluation today at a local Urgent Care facility, please use the link below. It will take you to a list of all of our available [facility], including address, phone number and hours of operation. Please do not delay care.  [facility]  If you or a family member do not have a primary care provider, use the link below to schedule a visit and establish care. When you choose a [facility] primary care physician or advanced practice provider, you gain a long-term partner in health. Find a Primary Care Provider  Learn more about [facility]'s in-office and virtual care options: [facility] - Get  Care Now

## 2022-08-26 NOTE — Progress Notes (Signed)
Virtual Visit Consent   Jenny Ewing, you are scheduled for a virtual visit with a  provider today. Just as with appointments in the office, your consent must be obtained to participate. Your consent will be active for this visit and any virtual visit you may have with one of our providers in the next 365 days. If you have a MyChart account, a copy of this consent can be sent to you electronically.  As this is a virtual visit, video technology does not allow for your provider to perform a traditional examination. This may limit your provider's ability to fully assess your condition. If your provider identifies any concerns that need to be evaluated in person or the need to arrange testing (such as labs, EKG, etc.), we will make arrangements to do so. Although advances in technology are sophisticated, we cannot ensure that it will always work on either your end or our end. If the connection with a video visit is poor, the visit may have to be switched to a telephone visit. With either a video or telephone visit, we are not always able to ensure that we have a secure connection.  By engaging in this virtual visit, you consent to the provision of healthcare and authorize for your insurance to be billed (if applicable) for the services provided during this visit. Depending on your insurance coverage, you may receive a charge related to this service.  I need to obtain your verbal consent now. Are you willing to proceed with your visit today? Jenny Ewing has provided verbal consent on 08/26/2022 for a virtual visit (video or telephone). Claiborne Rigg, NP  Date: 08/26/2022 5:20 PM  Virtual Visit via Video Note   I, Claiborne Rigg, connected with  Jenny Ewing  ([id number], Feb 10, 1956) on 08/26/22 at  6:00 PM EDT by a video-enabled telemedicine application and verified that I am speaking with the correct person using two identifiers.  Location: Patient: Virtual Visit Location Patient:  Home Provider: Virtual Visit Location Provider: Home Office   I discussed the limitations of evaluation and management by telemedicine and the availability of in person appointments. The patient expressed understanding and agreed to proceed.    History of Present Illness: Jenny Ewing is a 67 y.o. who identifies as a female who was assigned female at birth, and is being seen today for poison ivy.  Ms. Sporn reports being exposed to poison ivy/oak over 1 week ago and despite using numerous OTC topical agents the rash has persisted and worsened on several areas of her body. Associated symptoms include pruritus and skin irritation.   Problems:  Patient Active Problem List   Diagnosis Date Noted   Osteopenia 12/02/2020   Acute appendicitis 07/29/2017   Pharyngitis 08/26/2010   Depression, recurrent (HCC) 01/27/2009    Allergies:  Allergies  Allergen Reactions   Penicillins     REACTION: Hives   Medications:  Current Outpatient Medications:    predniSONE (DELTASONE) 10 MG tablet, Days 1-4 take 4 tablets (40 mg) daily  Days 5-8 take 3 tablets (30 mg) daily, Days 9-11 take 2 tablets (20 mg) daily, Days 12-14 take 1 tablet (10 mg) daily., Disp: 37 tablet, Rfl: 0   citalopram (CELEXA) 40 MG tablet, TAKE 1 TABLET BY MOUTH DAILY, Disp: 90 tablet, Rfl: 1  Observations/Objective: Patient is well-developed, well-nourished in no acute distress.  Resting comfortably  at home.  Head is normocephalic, atraumatic.  No labored breathing.  Speech is clear and  coherent with logical content.  Patient is alert and oriented at baseline.  Generalized erythematous patchy crusted rashes over both arms and face.  Assessment and Plan: 1. Poison ivy dermatitis - predniSONE (DELTASONE) 10 MG tablet; Days 1-4 take 4 tablets (40 mg) daily  Days 5-8 take 3 tablets (30 mg) daily, Days 9-11 take 2 tablets (20 mg) daily, Days 12-14 take 1 tablet (10 mg) daily.  Dispense: 37 tablet; Refill: 0    Follow Up  Instructions: I discussed the assessment and treatment plan with the patient. The patient was provided an opportunity to ask questions and all were answered. The patient agreed with the plan and demonstrated an understanding of the instructions.  A copy of instructions were sent to the patient via MyChart unless otherwise noted below.    The patient was advised to call back or seek an in-person evaluation if the symptoms worsen or if the condition fails to improve as anticipated.  Time:  I spent 11 minutes with the patient via telehealth technology discussing the above problems/concerns.    Claiborne Rigg, NP

## 2022-08-31 ENCOUNTER — Ambulatory Visit (INDEPENDENT_AMBULATORY_CARE_PROVIDER_SITE_OTHER): Payer: Medicare Other

## 2022-08-31 VITALS — Ht 65.0 in | Wt 142.0 lb

## 2022-08-31 DIAGNOSIS — Z0000 Encounter for general adult medical examination without abnormal findings: Secondary | ICD-10-CM

## 2022-08-31 NOTE — Progress Notes (Signed)
Subjective:   Jenny Ewing is a 67 y.o. female who presents for Medicare Annual (Subsequent) preventive examination.  Review of Systems    Virtual Visit via Telephone Note  I connected with  Jenny Ewing on 08/31/22 at  2:00 PM EDT by telephone and verified that I am speaking with the correct person using two identifiers.  Location: Patient: Home Provider: Office Persons participating in the virtual visit: patient/Nurse Health Advisor   I discussed the limitations, risks, security and privacy concerns of performing an evaluation and management service by telephone and the availability of in person appointments. The patient expressed understanding and agreed to proceed.  Interactive audio and video telecommunications were attempted between this nurse and patient, however failed, due to patient having technical difficulties OR patient did not have access to video capability.  We continued and completed visit with audio only.  Some vital signs may be absent or patient reported.   Jenny Rung, LPN  Cardiac Risk Factors include: advanced age (>55men, >22 women)     Objective:    Today's Vitals   08/31/22 1355  Weight: 142 lb (64.4 kg)  Height: 5\' 5"  (1.651 m)   Body mass index is 23.63 kg/m.     08/31/2022    2:01 PM 09/12/2021    3:59 PM 07/29/2020    1:26 PM 07/28/2017   11:30 PM  Advanced Directives  Does Patient Have a Medical Advance Directive? No No Yes No  Type of Best boy of [place];Living will   Does patient want to make changes to medical advance directive?   No - Patient declined   Copy of Healthcare Power of Attorney in Chart?   No - copy requested   Would patient like information on creating a medical advance directive? No - Patient declined   No - Patient declined    Current Medications (verified) Outpatient Encounter Medications as of 08/31/2022  Medication Sig   citalopram (CELEXA) 40 MG tablet TAKE 1 TABLET BY MOUTH  DAILY   predniSONE (DELTASONE) 10 MG tablet Days 1-4 take 4 tablets (40 mg) daily  Days 5-8 take 3 tablets (30 mg) daily, Days 9-11 take 2 tablets (20 mg) daily, Days 12-14 take 1 tablet (10 mg) daily.   No facility-administered encounter medications on file as of 08/31/2022.    Allergies (verified) Penicillins   History: Past Medical History:  Diagnosis Date   Allergy    DEPRESSION 01/27/2009   Past Surgical History:  Procedure Laterality Date   APPENDECTOMY  07/29/2017   LAPROSCOPIC   COLONOSCOPY  2008?   in [place]-Normal exam per pt-pt unable to drink all the Goly prep-had vomiting   LAPAROSCOPIC APPENDECTOMY N/A 07/29/2017   Procedure: APPENDECTOMY LAPAROSCOPIC;  Surgeon: Manus Rudd, MD;  Location: [facility] OR;  Service: General;  Laterality: N/A;   TUBAL LIGATION     Family History  Problem Relation Age of Onset   Hypertension Mother    Hypertension Father    Colon polyps Paternal Grandfather    Cancer Other        colon   Hypertension Other    Stroke Other    Colon cancer Neg Hx    Esophageal cancer Neg Hx    Rectal cancer Neg Hx    Stomach cancer Neg Hx    Breast cancer Neg Hx    Social History   Socioeconomic History   Marital status: Married    Spouse name: Not on file  Number of children: Not on file   Years of education: Not on file   Highest education level: Bachelor's degree (e.g., BA, AB, BS)  Occupational History   Not on file  Tobacco Use   Smoking status: Never   Smokeless tobacco: Never  Vaping Use   Vaping Use: Never used  Substance and Sexual Activity   Alcohol use: Yes    Alcohol/week: 5.0 standard drinks of alcohol    Types: 5 Standard drinks or equivalent per week   Drug use: Not Currently    Frequency: 2.0 times per week    Types: Marijuana   Sexual activity: Not on file  Other Topics Concern   Not on file  Social History Narrative   Not on file   Social Determinants of Health   Financial Resource Strain: Low Risk   (08/31/2022)   Overall Financial Resource Strain (CARDIA)    Difficulty of Paying Living Expenses: Not hard at all  Food Insecurity: No Food Insecurity (08/31/2022)   Hunger Vital Sign    Worried About Running Out of Food in the Last Year: Never true    Ran Out of Food in the Last Year: Never true  Transportation Needs: No Transportation Needs (08/31/2022)   PRAPARE - Administrator, Civil Service (Medical): No    Lack of Transportation (Non-Medical): No  Physical Activity: Insufficiently Active (08/31/2022)   Exercise Vital Sign    Days of Exercise per Week: 2 days    Minutes of Exercise per Session: 60 min  Stress: No Stress Concern Present (08/31/2022)   [employer] of Occupational Health - Occupational Stress Questionnaire    Feeling of Stress : Not at all  Social Connections: Moderately Isolated (08/31/2022)   Social Connection and Isolation Panel [NHANES]    Frequency of Communication with Friends and Family: More than three times a week    Frequency of Social Gatherings with Friends and Family: More than three times a week    Attends Religious Services: Never    Database administrator or Organizations: No    Attends Engineer, structural: Never    Marital Status: Married    Tobacco Counseling Counseling given: Not Answered   Clinical Intake:  Pre-visit preparation completed: No  Pain : No/denies pain     BMI - recorded: 23.63 Nutritional Status: BMI of 19-24  Normal Nutritional Risks: None Diabetes: No  How often do you need to have someone help you when you read instructions, pamphlets, or other written materials from your doctor or pharmacy?: 1 - Never  Diabetic?  No  Interpreter Needed?: No  Information entered by :: Theresa Mulligan LPN   Activities of Daily Living    08/31/2022    2:00 PM 09/12/2021    4:01 PM  In your present state of health, do you have any difficulty performing the following activities:  Hearing? 0 0  Vision?  0 0  Difficulty concentrating or making decisions? 0 0  Walking or climbing stairs? 0 0  Dressing or bathing? 0 0  Doing errands, shopping? 0 0  Preparing Food and eating ? N N  Using the Toilet? N N  In the past six months, have you accidently leaked urine? N Y  Do you have problems with loss of bowel control? N N  Managing your Medications? N N  Managing your Finances? N N  Housekeeping or managing your Housekeeping? N N    Patient Care Team: Kristian Covey, MD as  PCP - General  Indicate any recent Medical Services you may have received from other than [facility] providers in the past year (date may be approximate).     Assessment:   This is a routine wellness examination for Cor[place]ta.  Hearing/Vision screen Hearing Screening - Comments:: Denies hearing difficulties   Vision Screening - Comments:: Wears rx glasses - Not up to date with routine eye exams with  Deferred  Dietary issues and exercise activities discussed: Exercise limited by: None identified   Goals Addressed               This Visit's Progress     No current goals (pt-stated)         Depression Screen    08/31/2022    1:59 PM 09/12/2021    4:00 PM 08/21/2021    4:35 PM 07/29/2020    1:32 PM 07/29/2020    1:30 PM 05/06/2019    7:00 AM  PHQ 2/9 Scores  PHQ - 2 Score 0 0 0 0 0 0  PHQ- 9 Score   1   0    Fall Risk    08/31/2022    2:00 PM 09/12/2021    3:59 PM 08/21/2021    4:35 PM 08/17/2021    8:42 AM 07/29/2020    1:31 PM  Fall Risk   Falls in the past year? 0 1 1 1  0  Comment  twisted ankle     Number falls in past yr: 0 1 0 0 0  Injury with Fall? 0 1 1 1  0  Comment  fractured foot     Risk for fall due to : No Fall Risks Medication side effect No Fall Risks    Follow up Falls prevention discussed Falls evaluation completed;Education provided;Falls prevention discussed Falls evaluation completed      FALL RISK PREVENTION PERTAINING TO THE HOME:  Any stairs in or around the home? No  If so,  are there any without handrails? No  Home free of loose throw rugs in walkways, pet beds, electrical cords, etc? Yes  Adequate lighting in your home to reduce risk of falls? Yes   ASSISTIVE DEVICES UTILIZED TO PREVENT FALLS:  Life alert? No  Use of a cane, walker or w/c? No  Grab bars in the bathroom? Yes  Shower chair or bench in shower? No  Elevated toilet seat or a handicapped toilet? No   TIMED UP AND GO:  Was the test performed? No . Audio Visit    Cognitive Function:        08/31/2022    2:01 PM 09/12/2021    4:02 PM  6CIT Screen  What Year? 0 points 0 points  What month? 0 points 0 points  What time? 0 points 0 points  Count back from 20 0 points 0 points  Months in reverse 0 points 0 points  Repeat phrase 0 points 4 points  Total Score 0 points 4 points    Immunizations Immunization History  Administered Date(s) Administered   Pneumococcal Conjugate-13 12/02/2020   Pneumococcal Polysaccharide-23 05/06/2019   Tdap 06/10/2013    TDAP status: Up to date  Flu Vaccine status: Declined, Education has been provided regarding the importance of this vaccine but patient still declined. Advised may receive this vaccine at local pharmacy or Health Dept. Aware to provide a copy of the vaccination record if obtained from local pharmacy or Health Dept. Verbalized acceptance and understanding.  Pneumococcal vaccine status: Up to date  Covid-19 vaccine status:  Declined, Education has been provided regarding the importance of this vaccine but patient still declined. Advised may receive this vaccine at local pharmacy or Health Dept.or vaccine clinic. Aware to provide a copy of the vaccination record if obtained from local pharmacy or Health Dept. Verbalized acceptance and understanding.  Qualifies for Shingles Vaccine? Yes   Zostavax completed No   Shingrix Completed?: No.    Education has been provided regarding the importance of this vaccine. Patient has been advised to call  insurance company to determine out of pocket expense if they have not yet received this vaccine. Advised may also receive vaccine at local pharmacy or Health Dept. Verbalized acceptance and understanding.  Screening Tests Health Maintenance  Topic Date Due   COVID-19 Vaccine (1) 09/16/2022 (Originally 07/21/1956)   Zoster Vaccines- Shingrix (1 of 2) 12/01/2022 (Originally 01/20/2004)   INFLUENZA VACCINE  11/01/2022   DTaP/Tdap/Td (2 - Td or Tdap) 06/11/2023   Medicare Annual Wellness (AWV)  08/31/2023   Colonoscopy  09/10/2023   MAMMOGRAM  11/04/2023   Pneumonia Vaccine 43+ Years old  Completed   DEXA SCAN  Completed   Hepatitis C Screening  Completed   HPV VACCINES  Aged Out    Health Maintenance  There are no preventive care reminders to display for this patient.   Colorectal cancer screening: Type of screening: Colonoscopy. Completed 09/09/20. Repeat every 3 years  Mammogram status: Completed 11/03/21. Repeat every year  Bone Density status: Completed 09/02/20. Results reflect: Bone density results: OSTEOPOROSIS. Repeat every   years.  Lung Cancer Screening: (Low Dose CT Chest recommended if Age 72-80 years, 30 pack-year currently smoking OR have quit w/in 15years.) does not qualify.     Additional Screening:  Hepatitis C Screening: does qualify; Completed 04/22/17  Vision Screening: Recommended annual ophthalmology exams for early detection of glaucoma and other disorders of the eye. Is the patient up to date with their annual eye exam?  No  Who is the provider or what is the name of the office in which the patient attends annual eye exams? Deferred If pt is not established with a provider, would they like to be referred to a provider to establish care? No .   Dental Screening: Recommended annual dental exams for proper oral hygiene  Community Resource Referral / Chronic Care Management  CRR required this visit?  No   CCM required this visit?  No      Plan:     I  have personally reviewed and noted the following in the patient's chart:   Medical and social history Use of alcohol, tobacco or illicit drugs  Current medications and supplements including opioid prescriptions. Patient is not currently taking opioid prescriptions. Functional ability and status Nutritional status Physical activity Advanced directives List of other physicians Hospitalizations, surgeries, and ER visits in previous 12 months Vitals Screenings to include cognitive, depression, and falls Referrals and appointments  In addition, I have reviewed and discussed with patient certain preventive protocols, quality metrics, and best practice recommendations. A written personalized care plan for preventive services as well as general preventive health recommendations were provided to patient.     Jenny Rung, LPN   [id number]   Nurse Notes: None

## 2022-08-31 NOTE — Patient Instructions (Addendum)
Jenny Ewing , Thank you for taking time to come for your Medicare Wellness Visit. I appreciate your ongoing commitment to your health goals. Please review the following plan we discussed and let me know if I can assist you in the future.   These are the goals we discussed:  Goals       Exercise 3x per week (30 min per time)      No current goals (pt-stated)      Patient Stated      09/12/2021, no goals        This is a list of the screening recommended for you and due dates:  Health Maintenance  Topic Date Due   COVID-19 Vaccine (1) 09/16/2022*   Zoster (Shingles) Vaccine (1 of 2) 12/01/2022*   Flu Shot  11/01/2022   DTaP/Tdap/Td vaccine (2 - Td or Tdap) 06/11/2023   Medicare Annual Wellness Visit  08/31/2023   Colon Cancer Screening  09/10/2023   Mammogram  11/04/2023   Pneumonia Vaccine  Completed   DEXA scan (bone density measurement)  Completed   Hepatitis C Screening  Completed   HPV Vaccine  Aged Out  *Topic was postponed. The date shown is not the original due date.    Advanced directives: Advance directive discussed with you today. Even though you declined this today, please call our office should you change your mind, and we can give you the proper paperwork for you to fill out.   Conditions/risks identified: None  Next appointment: Follow up in one year for your annual wellness visit    Preventive Care 65 Years and Older, Female Preventive care refers to lifestyle choices and visits with your health care provider that can promote health and wellness. What does preventive care include? A yearly physical exam. This is also called an annual well check. Dental exams once or twice a year. Routine eye exams. Ask your health care provider how often you should have your eyes checked. Personal lifestyle choices, including: Daily care of your teeth and gums. Regular physical activity. Eating a healthy diet. Avoiding tobacco and drug use. Limiting alcohol use. Practicing  safe sex. Taking low-dose aspirin every day. Taking vitamin and mineral supplements as recommended by your health care provider. What happens during an annual well check? The services and screenings done by your health care provider during your annual well check will depend on your age, overall health, lifestyle risk factors, and family history of disease. Counseling  Your health care provider may ask you questions about your: Alcohol use. Tobacco use. Drug use. Emotional well-being. Home and relationship well-being. Sexual activity. Eating habits. History of falls. Memory and ability to understand (cognition). Work and work Astronomer. Reproductive health. Screening  You may have the following tests or measurements: Height, weight, and BMI. Blood pressure. Lipid and cholesterol levels. These may be checked every 5 years, or more frequently if you are over 67 years old. Skin check. Lung cancer screening. You may have this screening every year starting at age 33 if you have a 30-pack-year history of smoking and currently smoke or have quit within the past 15 years. Fecal occult blood test (FOBT) of the stool. You may have this test every year starting at age 55. Flexible sigmoidoscopy or colonoscopy. You may have a sigmoidoscopy every 5 years or a colonoscopy every 10 years starting at age 28. Hepatitis C blood test. Hepatitis B blood test. Sexually transmitted disease (STD) testing. Diabetes screening. This is done by checking your blood sugar (  glucose) after you have not eaten for a while (fasting). You may have this done every 1-3 years. Bone density scan. This is done to screen for osteoporosis. You may have this done starting at age 15. Mammogram. This may be done every 1-2 years. Talk to your health care provider about how often you should have regular mammograms. Talk with your health care provider about your test results, treatment options, and if necessary, the need for more  tests. Vaccines  Your health care provider may recommend certain vaccines, such as: Influenza vaccine. This is recommended every year. Tetanus, diphtheria, and acellular pertussis (Tdap, Td) vaccine. You may need a Td booster every 10 years. Zoster vaccine. You may need this after age 28. Pneumococcal 13-valent conjugate (PCV13) vaccine. One dose is recommended after age 60. Pneumococcal polysaccharide (PPSV23) vaccine. One dose is recommended after age 36. Talk to your health care provider about which screenings and vaccines you need and how often you need them. This information is not intended to replace advice given to you by your health care provider. Make sure you discuss any questions you have with your health care provider. Document Released: 04/15/2015 Document Revised: 12/07/2015 Document Reviewed: 01/18/2015 Elsevier Interactive Patient Education  2017 [employer].  Fall Prevention in the Home Falls can cause injuries. They can happen to people of all ages. There are many things you can do to make your home safe and to help prevent falls. What can I do on the outside of my home? Regularly fix the edges of walkways and driveways and fix any cracks. Remove anything that might make you trip as you walk through a door, such as a raised step or threshold. Trim any bushes or trees on the path to your home. Use bright outdoor lighting. Clear any walking paths of anything that might make someone trip, such as rocks or tools. Regularly check to see if handrails are loose or broken. Make sure that both sides of any steps have handrails. Any raised decks and porches should have guardrails on the edges. Have any leaves, snow, or ice cleared regularly. Use sand or salt on walking paths during winter. Clean up any spills in your garage right away. This includes oil or grease spills. What can I do in the bathroom? Use night lights. Install grab bars by the toilet and in the tub and shower.  Do not use towel bars as grab bars. Use non-skid mats or decals in the tub or shower. If you need to sit down in the shower, use a plastic, non-slip stool. Keep the floor dry. Clean up any water that spills on the floor as soon as it happens. Remove soap buildup in the tub or shower regularly. Attach bath mats securely with double-sided non-slip rug tape. Do not have throw rugs and other things on the floor that can make you trip. What can I do in the bedroom? Use night lights. Make sure that you have a light by your bed that is easy to reach. Do not use any sheets or blankets that are too big for your bed. They should not hang down onto the floor. Have a firm chair that has side arms. You can use this for support while you get dressed. Do not have throw rugs and other things on the floor that can make you trip. What can I do in the kitchen? Clean up any spills right away. Avoid walking on wet floors. Keep items that you use a lot in easy-to-reach places.  If you need to reach something above you, use a strong step stool that has a grab bar. Keep electrical cords out of the way. Do not use floor polish or wax that makes floors slippery. If you must use wax, use non-skid floor wax. Do not have throw rugs and other things on the floor that can make you trip. What can I do with my stairs? Do not leave any items on the stairs. Make sure that there are handrails on both sides of the stairs and use them. Fix handrails that are broken or loose. Make sure that handrails are as long as the stairways. Check any carpeting to make sure that it is firmly attached to the stairs. Fix any carpet that is loose or worn. Avoid having throw rugs at the top or bottom of the stairs. If you do have throw rugs, attach them to the floor with carpet tape. Make sure that you have a light switch at the top of the stairs and the bottom of the stairs. If you do not have them, ask someone to add them for you. What else  can I do to help prevent falls? Wear shoes that: Do not have high heels. Have rubber bottoms. Are comfortable and fit you well. Are closed at the toe. Do not wear sandals. If you use a stepladder: Make sure that it is fully opened. Do not climb a closed stepladder. Make sure that both sides of the stepladder are locked into place. Ask someone to hold it for you, if possible. Clearly mark and make sure that you can see: Any grab bars or handrails. First and last steps. Where the edge of each step is. Use tools that help you move around (mobility aids) if they are needed. These include: Canes. Walkers. Scooters. Crutches. Turn on the lights when you go into a dark area. Replace any light bulbs as soon as they burn out. Set up your furniture so you have a clear path. Avoid moving your furniture around. If any of your floors are uneven, fix them. If there are any pets around you, be aware of where they are. Review your medicines with your doctor. Some medicines can make you feel dizzy. This can increase your chance of falling. Ask your doctor what other things that you can do to help prevent falls. This information is not intended to replace advice given to you by your health care provider. Make sure you discuss any questions you have with your health care provider. Document Released: 01/13/2009 Document Revised: 08/25/2015 Document Reviewed: 04/23/2014 Elsevier Interactive Patient Education  2017 [employer].

## 2022-11-04 ENCOUNTER — Other Ambulatory Visit: Payer: Self-pay | Admitting: Family Medicine

## 2022-11-27 DIAGNOSIS — K080 Exfoliation of teeth due to systemic causes: Secondary | ICD-10-CM | POA: Diagnosis not present

## 2022-12-19 ENCOUNTER — Encounter: Payer: Self-pay | Admitting: Family Medicine

## 2022-12-19 ENCOUNTER — Ambulatory Visit (INDEPENDENT_AMBULATORY_CARE_PROVIDER_SITE_OTHER): Payer: Medicare Other | Admitting: Family Medicine

## 2022-12-19 VITALS — BP 150/96 | HR 88 | Temp 98.1°F | Ht 65.0 in | Wt 143.0 lb

## 2022-12-19 DIAGNOSIS — F439 Reaction to severe stress, unspecified: Secondary | ICD-10-CM

## 2022-12-19 NOTE — Progress Notes (Unsigned)
Established Patient Office Visit  Subjective   Patient ID: Jenny Ewing, female    DOB: [birth year]  Age: 67 y.o. MRN: [id number]  Chief Complaint  Patient presents with   Medication Consultation    HPI  {History (Optional):23778} Jenny Ewing is here to discuss stress issues and anxiety issues. She does have history of recurrent depression and has been treated for years with Celexa 40 mg daily.  Her depression symptoms are relatively stable.  Her bigger issue right now is really ongoing stress.  She and her husband have had some ongoing stress issues with her stepdaughter.  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  No known history of physical abuse but there has been some emotional abuse concerns.  Patient has taken low-dose alprazolam infrequently in the past but does not like the way this makes her feel and she knows to avoid regular benzodiazepines.  She has considered counseling but has struggled to find someone who takes Medicare plans.  Past Medical History:  Diagnosis Date   Allergy    DEPRESSION 01/27/2009   Past Surgical History:  Procedure Laterality Date   APPENDECTOMY  07/29/2017   LAPROSCOPIC   COLONOSCOPY  2008?   in [place]-Normal exam per pt-pt unable to drink all the Goly prep-had vomiting   LAPAROSCOPIC APPENDECTOMY N/A 07/29/2017   Procedure: APPENDECTOMY LAPAROSCOPIC;  Surgeon: Manus Rudd, MD;  Location: [facility] OR;  Service: General;  Laterality: N/A;   TUBAL LIGATION      reports that she has never smoked. She has never used smokeless tobacco. She reports current alcohol use of about 5.0 standard drinks of alcohol per week. She reports that she does not currently use drugs after having used the following drugs: Marijuana. Frequency: 2.00 times per week. family history includes Cancer in an other family member; Colon polyps in her paternal grandfather; Hypertension in her father, mother, and another family  member; Stroke in an other family member. Allergies  Allergen Reactions   Penicillins     REACTION: Hives    Review of Systems  Constitutional:  Negative for malaise/fatigue.  Eyes:  Negative for blurred vision.  Respiratory:  Negative for shortness of breath.   Cardiovascular:  Negative for chest pain.  Neurological:  Negative for dizziness, weakness and headaches.  Psychiatric/Behavioral:  The patient is nervous/anxious.       Objective:     BP (!) 150/96 (BP Location: Left Arm, Patient Position: Sitting, Cuff Size: Normal)   Pulse 88   Temp 98.1 F (36.7 C) (Oral)   Ht 5\' 5"  (1.651 m)   Wt 143 lb (64.9 kg)   SpO2 97%   BMI 23.80 kg/m  BP Readings from Last 3 Encounters:  12/19/22 (!) 150/96  02/09/22 (!) 154/84  08/21/21 130/80   Wt Readings from Last 3 Encounters:  12/19/22 143 lb (64.9 kg)  08/31/22 142 lb (64.4 kg)  02/09/22 142 lb 1.6 oz (64.5 kg)      Physical Exam Vitals reviewed.  Constitutional:      Appearance: Normal appearance.  Cardiovascular:     Rate and Rhythm: Normal rate and regular rhythm.     Heart sounds: No murmur heard. Pulmonary:     Effort: Pulmonary effort is normal.     Breath sounds: Normal breath sounds. No wheezing or rales.  Neurological:     Mental Status: She is alert.      No results found for any visits on 12/19/22.  {Labs (Optional):23779}  The ASCVD Risk score (Arnett DK, et al., 2019) failed to calculate for the following reasons:   Cannot find a previous HDL lab   Cannot find a previous total cholesterol lab    Assessment & Plan:   Chronic stress and anxiety issues.  She is dealing with tremendous stress regarding dealing with her stepdaughter and helping watch her 2 children several days per week.  -We did discuss possible medication changes but he decided to continue with her current Celexa 40 mg daily which has worked well for years for her -We strongly advocated counseling and she was given brochure  for our behavioral health division to consider setting up follow-up -Discussed other nonpharmacologic ways of dealing with stress adequately.  Handout given.  Evelena Peat, MD

## 2023-01-02 DIAGNOSIS — K080 Exfoliation of teeth due to systemic causes: Secondary | ICD-10-CM | POA: Diagnosis not present

## 2023-02-04 ENCOUNTER — Other Ambulatory Visit: Payer: Self-pay | Admitting: Family Medicine

## 2023-03-08 DIAGNOSIS — H524 Presbyopia: Secondary | ICD-10-CM | POA: Diagnosis not present

## 2023-05-03 ENCOUNTER — Other Ambulatory Visit: Payer: Self-pay | Admitting: Family Medicine

## 2023-05-31 ENCOUNTER — Encounter: Payer: Self-pay | Admitting: Family Medicine

## 2023-07-09 DIAGNOSIS — K080 Exfoliation of teeth due to systemic causes: Secondary | ICD-10-CM | POA: Diagnosis not present

## 2023-08-07 ENCOUNTER — Other Ambulatory Visit: Payer: Self-pay | Admitting: Family Medicine

## 2023-09-10 ENCOUNTER — Encounter: Payer: Medicare Other | Admitting: Family Medicine

## 2023-09-13 IMAGING — DX DG FOOT COMPLETE 3+V*R*
3 series · 3 of 3 positions shown · non-contrast
Comparison: None Available.

CLINICAL DATA: Two weeks right distal lateral foot pain.

EXAM:
RIGHT FOOT COMPLETE - 3+ VIEW

[foot ap]
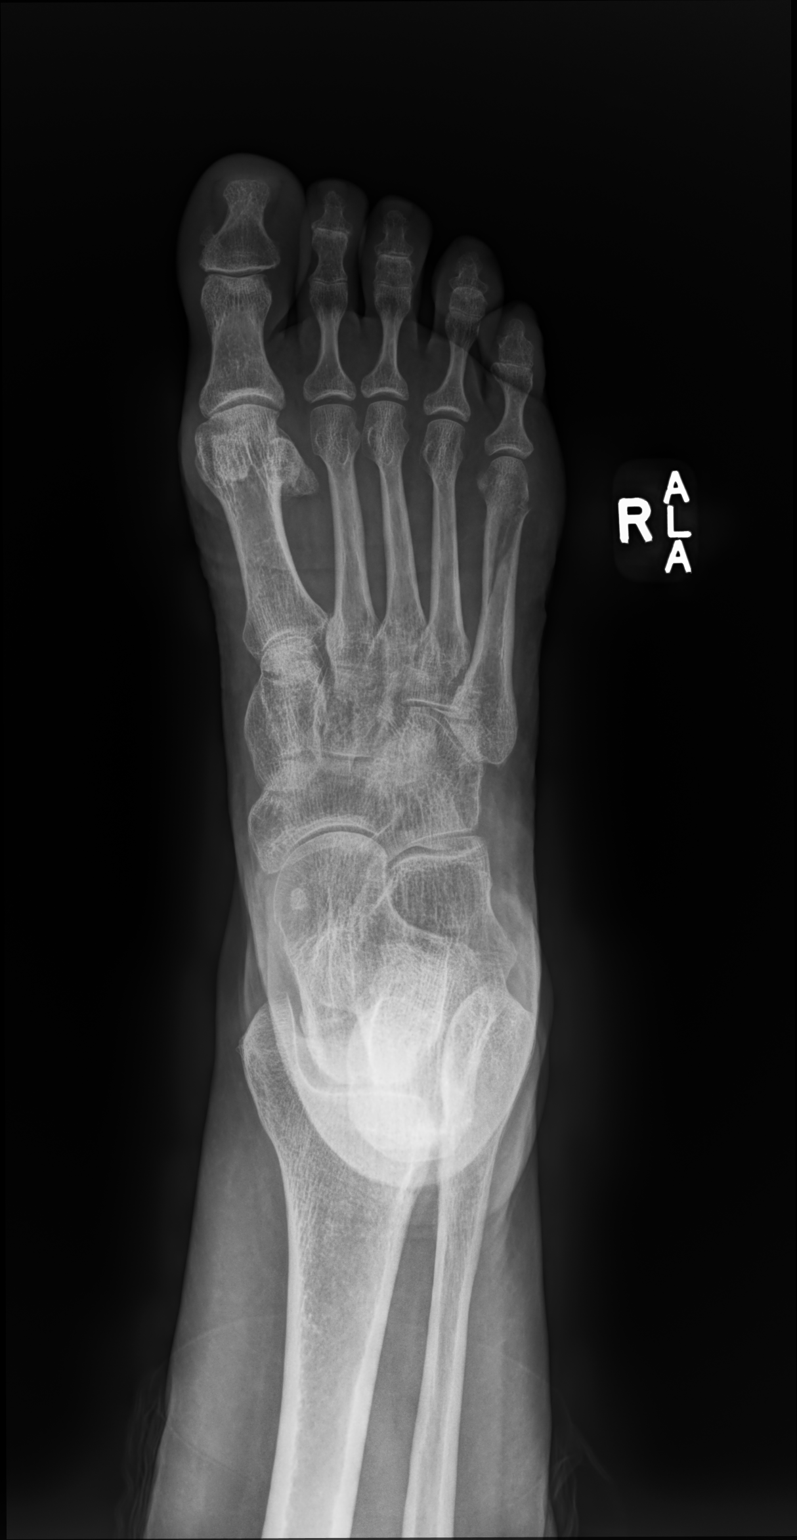

[foot mlo]
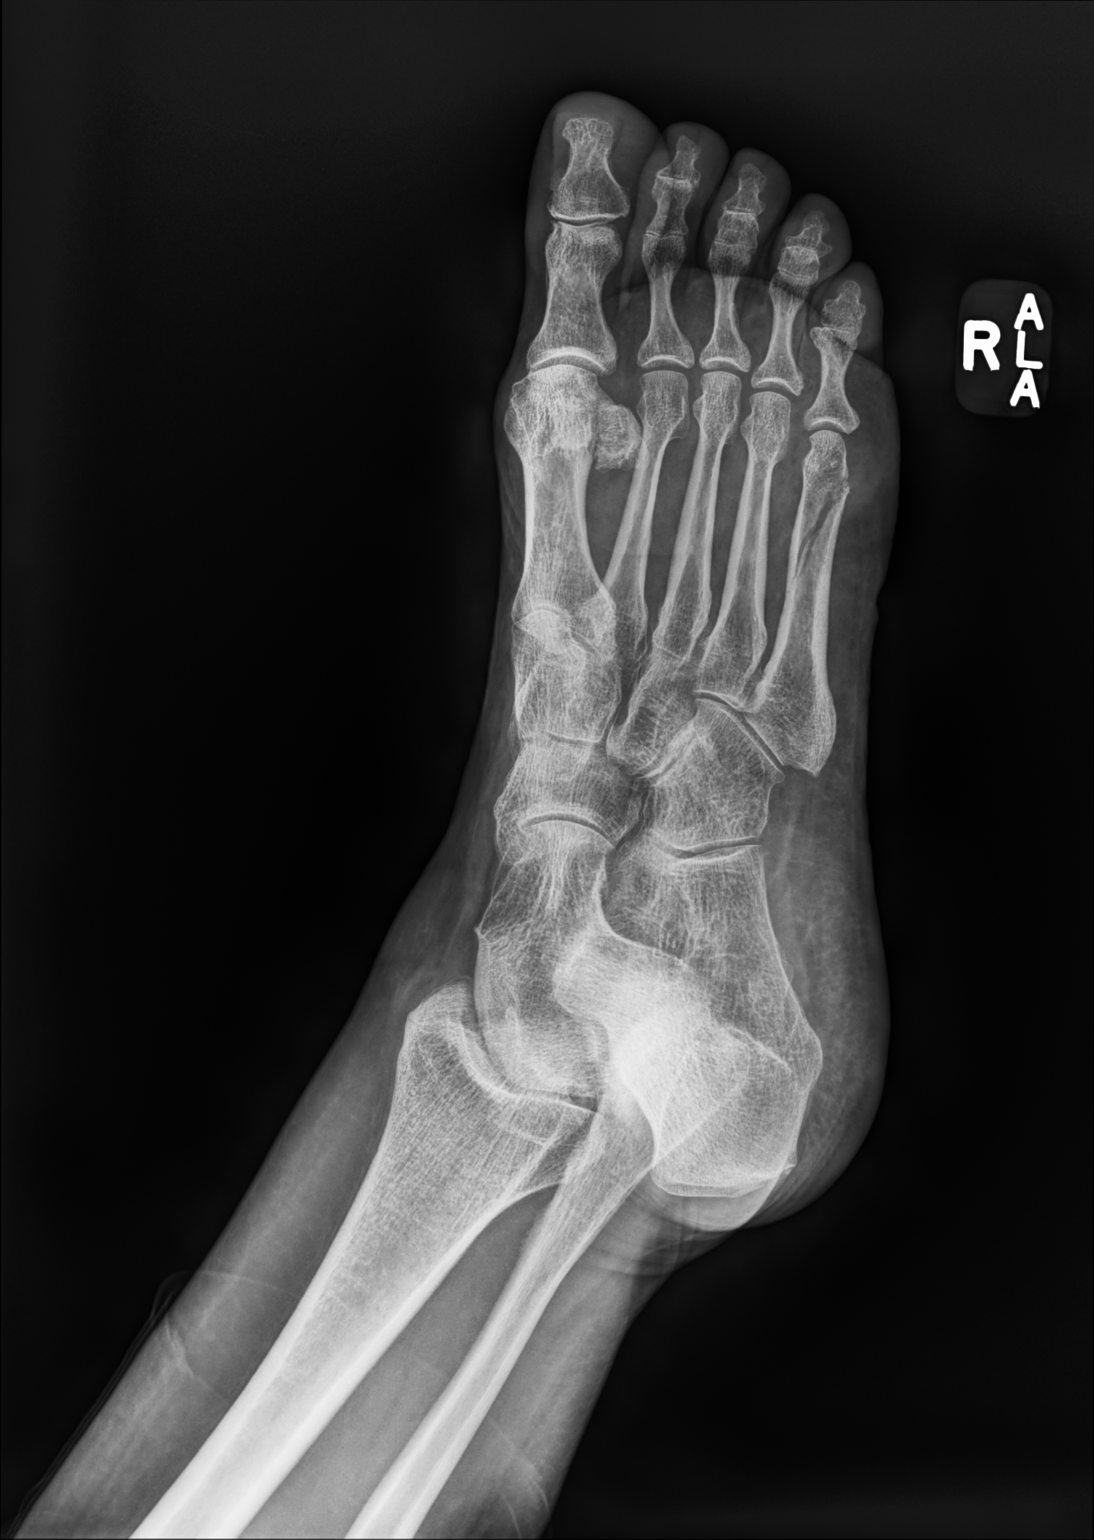

[foot lat]
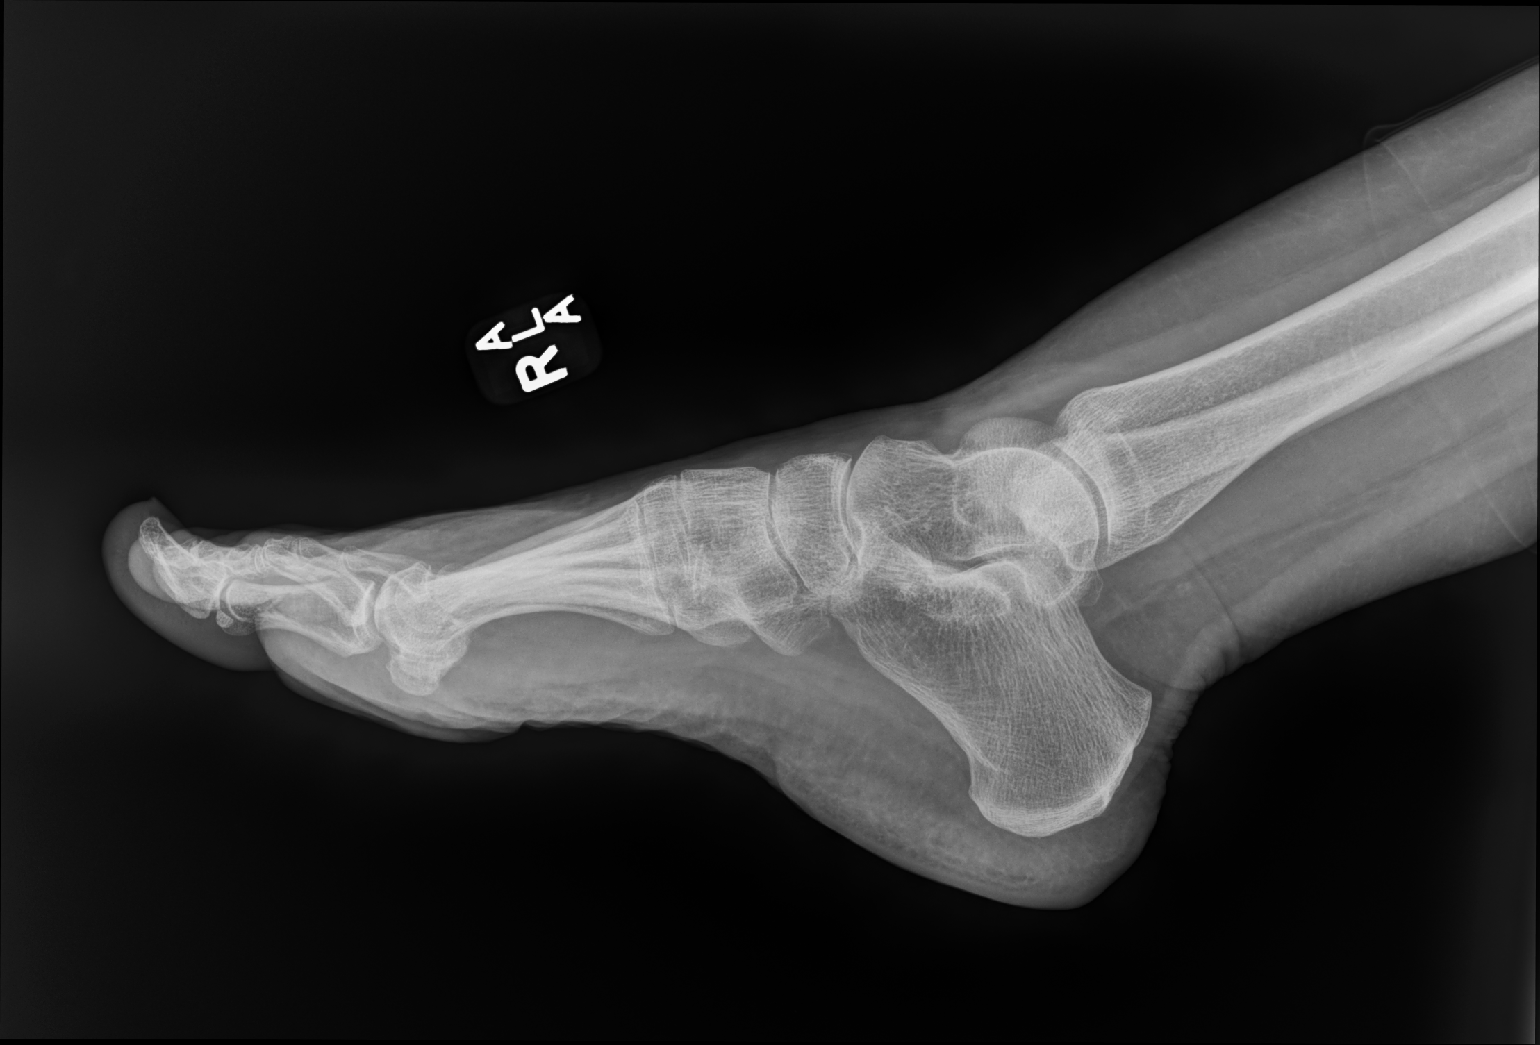

[3 of 3 positions shown; findings below may reference images not displayed]

FINDINGS: There is an oblique fracture of the distal 50% of the fifth
metatarsal shaft with up to 2 mm fracture line diastasis but
otherwise no significant displacement. No intra-articular extension.
Mild joint space narrowing of the interphalangeal joints and great
toe metatarsophalangeal joint. Mild lateral great toe metatarsal
head degenerative spurring. No dislocation.
IMPRESSION: Oblique, acute fracture of the distal 50% of fifth metatarsal shaft
with up to 2 mm diastasis.

## 2023-09-17 ENCOUNTER — Ambulatory Visit (INDEPENDENT_AMBULATORY_CARE_PROVIDER_SITE_OTHER): Admitting: Family Medicine

## 2023-09-17 DIAGNOSIS — M8580 Other specified disorders of bone density and structure, unspecified site: Secondary | ICD-10-CM

## 2023-09-17 NOTE — Patient Instructions (Addendum)
 I really enjoyed getting to talk with you today! I am available on Tuesdays and Thursdays for virtual visits if you have any questions or concerns, or if I can be of any further assistance.   CHECKLIST FROM ANNUAL WELLNESS VISIT:  -Follow up (please call to schedule if not scheduled after visit):   -yearly for annual wellness visit with primary care office  Here is a list of your preventive care/health maintenance measures and the plan for each if any are due:  PLAN For any measures below that may be due:    1. Call to schedule your colonoscopy:  [phone]   2. Can get the vaccines at the pharmacy, please provide copy of receipt to Dr. Darren Em when you do so that we can keep your immunization record up to date.   3. I ordered the bone density test. If you are not contacted about this in the next 1-2 weeks, please call our office: [phone]     Health Maintenance  Topic Date Due   Zoster Vaccines- Shingrix (1 of 2) Never done   COVID-19 Vaccine (1 - 2024-25 season) Never done   DTaP/Tdap/Td (2 - Td or Tdap) 06/11/2023   Colonoscopy  09/10/2023   INFLUENZA VACCINE  11/01/2023   MAMMOGRAM  11/04/2023   Medicare Annual Wellness (AWV)  09/16/2024   Pneumococcal Vaccine: 50+ Years  Completed   DEXA SCAN  Completed   Hepatitis C Screening  Completed   HPV VACCINES  Aged Out   Meningococcal B Vaccine  Aged Out    -See a dentist at least yearly  -Get your eyes checked and then per your eye specialist's recommendations  -Other issues addressed today:   -I have included below further information regarding a healthy whole foods based diet, physical activity guidelines for adults, stress management and opportunities for social connections. I hope you find this information useful.    -----------------------------------------------------------------------------------------------------------------------------------------------------------------------------------------------------------------------------------------------------------    NUTRITION: -eat real food: lots of colorful vegetables (half the plate) and fruits -5-7 servings of vegetables and fruits per day (fresh or steamed is best), exp. 2 servings of vegetables with lunch and dinner and 2 servings of fruit per day. Berries and greens such as kale and collards are great choices.  -consume on a regular basis:  fresh fruits, fresh veggies, fish, nuts, seeds, healthy oils (such as olive oil, avocado oil), whole grains (make sure for bread/pasta/crackers/etc., that the first ingredient on label contains the word whole), legumes. -can eat small amounts of dairy and lean meat (no larger than the palm of your hand), but avoid processed meats such as ham, bacon, lunch meat, etc. -drink water  -try to avoid fast food and pre-packaged foods, processed meat, ultra processed foods/beverages (donuts, candy, etc.) -most experts advise limiting sodium to < 2300mg  per day, should limit further is any chronic conditions such as high blood pressure, heart disease, diabetes, etc. The American Heart Association advised that < 1500mg  is is ideal -try to avoid foods/beverages that contain any ingredients with names you do not recognize  -try to avoid foods/beverages  with added sugar or sweeteners/sweets  -try to avoid sweet drinks (including diet drinks): soda, juice, Gatorade, sweet tea, power drinks, diet drinks -try to avoid white rice, white bread, pasta (unless whole grain)  EXERCISE GUIDELINES FOR ADULTS: -if you wish to increase your physical activity, do so gradually and with the approval of your doctor -STOP and seek medical care immediately if you have any chest pain, chest discomfort or trouble breathing when  starting or  increasing exercise  -move and stretch your body, legs, feet and arms when sitting for long periods -Physical activity guidelines for optimal health in adults: -get at least 150 minutes per week of moderate exercise (can talk, but not sing); this is about 20-30 minutes of sustained activity 5-7 days per week or two 10-15 minute episodes of sustained activity 5-7 days per week -do some muscle building/resistance training/strength training at least 2 days per week  -balance exercises 3+ days per week:   Stand somewhere where you have something sturdy to hold onto if you lose balance    1) lift up on toes, then back down, start with 5x per day and work up to 20x   2) stand and lift one leg straight out to the side so that foot is a few inches of the floor, start with 5x each side and work up to 20x each side   3) stand on one foot, start with 5 seconds each side and work up to 20 seconds on each side  If you need ideas or help with getting more active:  -Silver sneakers https://tools.silversneakers.com  -Walk with a Doc: http://www.duncan-williams.com/  -try to include resistance (weight lifting/strength building) and balance exercises twice per week: or the following link for ideas: http://castillo-powell.com/  BuyDucts.dk  STRESS MANAGEMENT: -can try meditating, or just sitting quietly with deep breathing while intentionally relaxing all parts of your body for 5 minutes daily -if you need further help with stress, anxiety or depression please follow up with your primary doctor or contact the wonderful folks at [facility]: [phone]  SOCIAL CONNECTIONS: -options in [place] if you wish to engage in more social and exercise related activities:  -Silver sneakers https://tools.silversneakers.com  -Walk with a Doc: http://www.duncan-williams.com/  -Check out the [facility] Active Adults 50+  section on the [place] [employer] (hiking clubs, book clubs, cards and games, chess, exercise classes, aquatic classes and much more) - see the website for details: https://www.[place].gov/departments/parks-recreation/active-adults50  -YouTube has lots of exercise videos for different ages and abilities as well  -[facility] (a variety of indoor and outdoor inperson activities for adults). ([phone])566-5430. [address]  -Virtual Online Classes (a variety of topics): see seniorplanet.org or call [phone]  -consider volunteering at a school, hospice center, church, senior center or elsewhere    ADVANCED HEALTHCARE DIRECTIVES:  [place] Advanced Directives assistance:   ExpressWeek.com.cy  Everyone should have advanced health care directives in place. This is so that you get the care you want, should you ever be in a situation where you are unable to make your own medical decisions.   From the [place] Advanced Directive Website: Advance Health Care Directives are legal documents in which you give written instructions about your health care if, in the future, you cannot speak for yourself.   A health care power of attorney allows you to name a person you trust to make your health care decisions if you cannot make them yourself. A declaration of a desire for a natural death (or living will) is document, which states that you desire not to have your life prolonged by extraordinary measures if you have a terminal or incurable illness or if you are in a vegetative state. An advance instruction for mental health treatment makes a declaration of instructions, information and preferences regarding your mental health treatment. It also states that you are aware that the advance instruction authorizes a mental health treatment provider to act according to your  wishes. It may also outline your consent or refusal of  mental health treatment. A declaration of an anatomical gift allows anyone over the age of 46 to make a gift by will, organ donor card or other document.   Please see the following website or an elder law attorney for forms, FAQs and for completion of advanced directives: [place]  Print production planner Health Care Directives Advance Health Care Directives (http://guzman.com/)  Or copy and paste the following to your web browser: PoshChat.fi

## 2023-09-17 NOTE — Progress Notes (Signed)
 PATIENT CHECK-IN and HEALTH RISK ASSESSMENT QUESTIONNAIRE:  -completed by phone/video for upcoming Medicare Preventive Visit  -PLEASE SELECT NOT IN PERSON for the method of visit.   Pre-Visit Check-in: 1)Vitals (height, wt, BP, etc) - record in vitals section for visit on day of visit Request home vitals (wt, BP, etc.) and enter into vitals, THEN update Vital Signs SmartPhrase below at the top of the HPI. See below.  2)Review and Update Medications, Allergies PMH, Surgeries, Social history in Epic 3)Hospitalizations in the last year with date/reason? n  4)Review and Update Care Team (patient's specialists) in Epic 5) Complete PHQ9 in Epic  6) Complete Fall Screening in Epic 7)Review all Health Maintenance Due and order under PCP if not done.  Medicare Wellness Patient Questionnaire:  Answer theses question about your habits: How often do you have a drink containing alcohol? Yes, nightly How many drinks containing alcohol do you have on a typical day when you are drinking? 1-2 How often do you have six or more drinks on one occasion?never Have you ever smoked? n How many packs a day do/did you smoke? n Do you use smokeless tobacco?n Do you use an illicit drugs?n On average, how many days per week do you engage in moderate to strenuous exercise (like a brisk walk)? Walks a lot at work, also likes to [employer] on the weekend Typical Diet: lots of veggies, proteins, whole grains  Answer theses question about your everyday activities: Can you perform most household chores?y Are you deaf or have significant trouble hearing?n Do you feel that you have a problem with memory?n Do you feel safe at home?y Last dentist visit? Goes on regular basis 8. Do you have any difficulty performing your everyday activities?n Are you having any difficulty walking, taking medications on your own, and or difficulty managing daily home needs?n Do you have difficulty walking or climbing stairs?n Do  you have difficulty dressing or bathing?n Do you have difficulty doing errands alone such as visiting a doctor's office or shopping?n Do you currently have any difficulty preparing food and eating?n Do you currently have any difficulty using the toilet?n Do you have any difficulty managing your finances?n Do you have any difficulties with housekeeping of managing your housekeeping?n   Do you have Advanced Directives in place (Living Will, Healthcare Power or Attorney)? No, on to do list   Last eye Exam and location? About 1 year ago, goes on regular basis, does not recall name at the moment   Do you currently use prescribed or non-prescribed narcotic or opioid pain medications?n  Do you have a history or close family history of breast, ovarian, tubal or peritoneal cancer or a family member with BRCA (breast cancer susceptibility 1 and 2) gene mutations? n    ----------------------------------------------------------------------------------------------------------------------------------------------------------------------------------------------------------------------  Because this visit was a virtual/telehealth visit, some criteria may be missing or patient reported. Any vitals not documented were not able to be obtained and vitals that have been documented are patient reported.    MEDICARE ANNUAL PREVENTIVE VISIT WITH PROVIDER: (Welcome to Medicare, initial annual wellness or annual wellness exam)  Virtual Visit via Video Note  I connected with Jenny Ewing on 09/17/23 by and verified that I am speaking with the correct person using two identifiers.  Location patient: home Location provider:work or home office Persons participating in the virtual visit: patient, provider  Concerns and/or follow up today: no concerns, stable.   See HM section in Epic for other details of completed HM.  ROS: negative for report of fevers, unintentional weight loss, vision changes, vision  loss, hearing loss or change, chest pain, sob, hemoptysis, melena, hematochezia, hematuria, falls, bleeding or bruising, thoughts of suicide or self harm, memory loss  Patient-completed extensive health risk assessment - reviewed and discussed with the patient: See Health Risk Assessment completed with patient prior to the visit either above or in recent phone note. This was reviewed in detailed with the patient today and appropriate recommendations, orders and referrals were placed as needed per Summary below and patient instructions.   Review of Medical History: -PMH, PSH, Family History and current specialty and care providers reviewed and updated and listed below   Patient Care Team: Marquetta Sit, MD as PCP - General   Past Medical History:  Diagnosis Date   Allergy    DEPRESSION 01/27/2009    Past Surgical History:  Procedure Laterality Date   APPENDECTOMY  07/29/2017   LAPROSCOPIC   COLONOSCOPY  2008?   in [place]-Normal exam per pt-pt unable to drink all the Goly prep-had vomiting   LAPAROSCOPIC APPENDECTOMY N/A 07/29/2017   Procedure: APPENDECTOMY LAPAROSCOPIC;  Surgeon: Dareen Ebbing, MD;  Location: [facility] OR;  Service: General;  Laterality: N/A;   TUBAL LIGATION      Social History   Socioeconomic History   Marital status: Married    Spouse name: Not on file   Number of children: Not on file   Years of education: Not on file   Highest education level: Bachelor's degree (e.g., BA, AB, BS)  Occupational History   Not on file  Tobacco Use   Smoking status: Never   Smokeless tobacco: Never  Vaping Use   Vaping status: Never Used  Substance and Sexual Activity   Alcohol use: Yes    Alcohol/week: 5.0 standard drinks of alcohol    Types: 5 Standard drinks or equivalent per week   Drug use: Not Currently    Frequency: 2.0 times per week    Types: Marijuana   Sexual activity: Not on file  Other Topics Concern   Not on file  Social History Narrative   Not  on file   Social Drivers of Health   Financial Resource Strain: Low Risk  (08/31/2022)   Overall Financial Resource Strain (CARDIA)    Difficulty of Paying Living Expenses: Not hard at all  Food Insecurity: No Food Insecurity (08/31/2022)   Hunger Vital Sign    Worried About Running Out of Food in the Last Year: Never true    Ran Out of Food in the Last Year: Never true  Transportation Needs: No Transportation Needs (08/31/2022)   PRAPARE - Administrator, Civil Service (Medical): No    Lack of Transportation (Non-Medical): No  Physical Activity: Insufficiently Active (08/31/2022)   Exercise Vital Sign    Days of Exercise per Week: 2 days    Minutes of Exercise per Session: 60 min  Stress: No Stress Concern Present (08/31/2022)   [employer] of Occupational Health - Occupational Stress Questionnaire    Feeling of Stress : Not at all  Social Connections: Moderately Isolated (08/31/2022)   Social Connection and Isolation Panel    Frequency of Communication with Friends and Family: More than three times a week    Frequency of Social Gatherings with Friends and Family: More than three times a week    Attends Religious Services: Never    Database administrator or Organizations: No    Attends Banker Meetings:  Never    Marital Status: Married  Catering manager Violence: Not At Risk (08/31/2022)   Humiliation, Afraid, Rape, and Kick questionnaire    Fear of Current or Ex-Partner: No    Emotionally Abused: No    Physically Abused: No    Sexually Abused: No    Family History  Problem Relation Age of Onset   Hypertension Mother    Hypertension Father    Colon polyps Paternal Grandfather    Cancer Other        colon   Hypertension Other    Stroke Other    Colon cancer Neg Hx    Esophageal cancer Neg Hx    Rectal cancer Neg Hx    Stomach cancer Neg Hx    Breast cancer Neg Hx     Current Outpatient Medications on File Prior to Visit  Medication Sig  Dispense Refill   citalopram  (CELEXA ) 40 MG tablet TAKE 1 TABLET BY MOUTH DAILY 90 tablet 0   No current facility-administered medications on file prior to visit.    Allergies  Allergen Reactions   Penicillins     REACTION: Hives       Physical Exam Vitals requested from patient and listed below if patient had equipment and was able to obtain at home for this virtual visit: There were no vitals filed for this visit. Estimated body mass index is 23.8 kg/m as calculated from the following:   Height as of 12/19/22: 5' 5 (1.651 m).   Weight as of 12/19/22: 143 lb (64.9 kg).  EKG (optional): deferred due to virtual visit  GENERAL: alert, oriented, no acute distress detected, full vision exam deferred due to pandemic and/or virtual encounter   HEENT: atraumatic, conjunttiva clear, no obvious abnormalities on inspection of external nose and ears  NECK: normal movements of the head and neck  LUNGS: on inspection no signs of respiratory distress, breathing rate appears normal, no obvious gross SOB, gasping or wheezing  CV: no obvious cyanosis  MS: moves all visible extremities without noticeable abnormality  PSYCH/NEURO: pleasant and cooperative, no obvious depression or anxiety, speech and thought processing grossly intact, Cognitive function grossly intact  Flowsheet Row Office Visit from 08/21/2021 in [facility] [place]  PHQ-9 Total Score 1        09/17/2023    5:02 PM 08/31/2022    1:59 PM 09/12/2021    4:00 PM 08/21/2021    4:35 PM 07/29/2020    1:32 PM  Depression screen PHQ 2/9  Decreased Interest 0 0 0 0 0  Down, Depressed, Hopeless 0 0 0 0 0  PHQ - 2 Score 0 0 0 0 0  Altered sleeping    1   Tired, decreased energy    0   Change in appetite    0   Feeling bad or failure about yourself     0   Trouble concentrating    0   Moving slowly or fidgety/restless    0   Suicidal thoughts    0   PHQ-9 Score    1   Difficult doing work/chores     Not difficult at all        08/17/2021    8:42 AM 08/21/2021    4:35 PM 09/12/2021    3:59 PM 08/31/2022    2:00 PM 09/17/2023    5:01 PM  Fall Risk  Falls in the past year? 1 1 1  0 0  Was there an injury with  Fall? 1 1 1  0 0  Was there an injury with Fall? - Comments   fractured foot    Fall Risk Category Calculator 2 2 3  0 0  Fall Risk Category (Retired) Moderate  Moderate  High     (RETIRED) Patient Fall Risk Level  Low fall risk  Low fall risk     Patient at Risk for Falls Due to  No Fall Risks Medication side effect No Fall Risks   Fall risk Follow up  Falls evaluation completed  Falls evaluation completed;Education provided;Falls prevention discussed  Falls prevention discussed Education provided;Falls evaluation completed     Data saved with a previous flowsheet row definition     SUMMARY AND PLAN:  Osteopenia, unspecified location - Plan: DG Bone Density     Discussed applicable health maintenance/preventive health measures and advised and referred or ordered per patient preferences: -she agrees to call to schedule her colonsocopy - number provided -discussed vaccines due risks/recs and she know can get at the pharmacy -discussed dexa and placed order Health Maintenance  Topic Date Due   Zoster Vaccines- Shingrix (1 of 2) Never done   COVID-19 Vaccine (1 - 2024-25 season) Never done   DTaP/Tdap/Td (2 - Td or Tdap) 06/11/2023   Colonoscopy  09/10/2023   INFLUENZA VACCINE  11/01/2023   MAMMOGRAM  11/04/2023   Medicare Annual Wellness (AWV)  09/16/2024   Pneumococcal Vaccine: 50+ Years  Completed   DEXA SCAN  Completed   Hepatitis C Screening  Completed   HPV VACCINES  Aged Out   Meningococcal B Vaccine  Aged Out      Education and counseling on the following was provided based on the above review of health and a plan/checklist for the patient, along with additional information discussed, was provided for the patient in the patient instructions :  -Advised on  importance of completing advanced directives, discussed options for completing and provided information in patient instructions as well -Provided counseling and plan for difficulty hearing  -Provided counseling and plan for increased risk of falling if applicable per above screening. Reviewed and demonstrated safe balance exercises that can be done at home to improve balance and discussed exercise guidelines for adults with include balance exercises at least 3 days per week.  -Advised and counseled on a healthy lifestyle - including the importance of a healthy diet, regular physical activity, social connections and stress management. -Reviewed patient's current diet. Advised and counseled on a whole foods based healthy diet. A summary of a healthy diet was provided in the Patient Instructions.  -reviewed patient's current physical activity level and discussed exercise guidelines for adults. Discussed community resources and ideas for safe exercise at home to assist in meeting exercise guideline recommendations in a safe and healthy way.  -Advise yearly dental visits at minimum and regular eye exams -Advised and counseled on alcohol safe limits, risks/ tobacco use, risks of smoking and offered counseling/help, drug, opoid use/misuse   Follow up: see patient instructions     Patient Instructions  I really enjoyed getting to talk with you today! I am available on Tuesdays and Thursdays for virtual visits if you have any questions or concerns, or if I can be of any further assistance.   CHECKLIST FROM ANNUAL WELLNESS VISIT:  -Follow up (please call to schedule if not scheduled after visit):   -yearly for annual wellness visit with primary care office  Here is a list of your preventive care/health maintenance measures and the plan for each  if any are due:  PLAN For any measures below that may be due:    1. Call to schedule your colonoscopy:  [phone]   2. Can get the vaccines at the  pharmacy, please provide copy of receipt to Dr. Darren Em when you do so that we can keep your immunization record up to date.   3. I ordered the bone density test. If you are not contacted about this in the next 1-2 weeks, please call our office: [phone]     Health Maintenance  Topic Date Due   Zoster Vaccines- Shingrix (1 of 2) Never done   COVID-19 Vaccine (1 - 2024-25 season) Never done   DTaP/Tdap/Td (2 - Td or Tdap) 06/11/2023   Colonoscopy  09/10/2023   INFLUENZA VACCINE  11/01/2023   MAMMOGRAM  11/04/2023   Medicare Annual Wellness (AWV)  09/16/2024   Pneumococcal Vaccine: 50+ Years  Completed   DEXA SCAN  Completed   Hepatitis C Screening  Completed   HPV VACCINES  Aged Out   Meningococcal B Vaccine  Aged Out    -See a dentist at least yearly  -Get your eyes checked and then per your eye specialist's recommendations  -Other issues addressed today:   -I have included below further information regarding a healthy whole foods based diet, physical activity guidelines for adults, stress management and opportunities for social connections. I hope you find this information useful.   -----------------------------------------------------------------------------------------------------------------------------------------------------------------------------------------------------------------------------------------------------------    NUTRITION: -eat real food: lots of colorful vegetables (half the plate) and fruits -5-7 servings of vegetables and fruits per day (fresh or steamed is best), exp. 2 servings of vegetables with lunch and dinner and 2 servings of fruit per day. Berries and greens such as kale and collards are great choices.  -consume on a regular basis:  fresh fruits, fresh veggies, fish, nuts, seeds, healthy oils (such as olive oil, avocado oil), whole grains (make sure for bread/pasta/crackers/etc., that the first ingredient on label contains the word  whole), legumes. -can eat small amounts of dairy and lean meat (no larger than the palm of your hand), but avoid processed meats such as ham, bacon, lunch meat, etc. -drink water  -try to avoid fast food and pre-packaged foods, processed meat, ultra processed foods/beverages (donuts, candy, etc.) -most experts advise limiting sodium to < 2300mg  per day, should limit further is any chronic conditions such as high blood pressure, heart disease, diabetes, etc. The American Heart Association advised that < 1500mg  is is ideal -try to avoid foods/beverages that contain any ingredients with names you do not recognize  -try to avoid foods/beverages  with added sugar or sweeteners/sweets  -try to avoid sweet drinks (including diet drinks): soda, juice, Gatorade, sweet tea, power drinks, diet drinks -try to avoid white rice, white bread, pasta (unless whole grain)  EXERCISE GUIDELINES FOR ADULTS: -if you wish to increase your physical activity, do so gradually and with the approval of your doctor -STOP and seek medical care immediately if you have any chest pain, chest discomfort or trouble breathing when starting or increasing exercise  -move and stretch your body, legs, feet and arms when sitting for long periods -Physical activity guidelines for optimal health in adults: -get at least 150 minutes per week of moderate exercise (can talk, but not sing); this is about 20-30 minutes of sustained activity 5-7 days per week or two 10-15 minute episodes of sustained activity 5-7 days per week -do some muscle building/resistance training/strength training at least 2 days per week  -balance  exercises 3+ days per week:   Stand somewhere where you have something sturdy to hold onto if you lose balance    1) lift up on toes, then back down, start with 5x per day and work up to 20x   2) stand and lift one leg straight out to the side so that foot is a few inches of the floor, start with 5x each side and work up to  20x each side   3) stand on one foot, start with 5 seconds each side and work up to 20 seconds on each side  If you need ideas or help with getting more active:  -Silver sneakers https://tools.silversneakers.com  -Walk with a Doc: http://www.duncan-williams.com/  -try to include resistance (weight lifting/strength building) and balance exercises twice per week: or the following link for ideas: http://castillo-powell.com/  BuyDucts.dk  STRESS MANAGEMENT: -can try meditating, or just sitting quietly with deep breathing while intentionally relaxing all parts of your body for 5 minutes daily -if you need further help with stress, anxiety or depression please follow up with your primary doctor or contact the wonderful folks at [facility]: [phone]  SOCIAL CONNECTIONS: -options in [place] if you wish to engage in more social and exercise related activities:  -Silver sneakers https://tools.silversneakers.com  -Walk with a Doc: http://www.duncan-williams.com/  -Check out the [facility] Active Adults 50+ section on the [place] of [employer] (hiking clubs, book clubs, cards and games, chess, exercise classes, aquatic classes and much more) - see the website for details: https://www.[place].gov/departments/parks-recreation/active-adults50  -YouTube has lots of exercise videos for different ages and abilities as well  -[facility] (a variety of indoor and outdoor inperson activities for adults). [phone]) [phone]. [address]  -Virtual Online Classes (a variety of topics): see seniorplanet.org or call [phone]  -consider volunteering at a school, hospice center, church, senior center or elsewhere    ADVANCED HEALTHCARE DIRECTIVES:  [place] Advanced Directives  assistance:   ExpressWeek.com.cy  Everyone should have advanced health care directives in place. This is so that you get the care you want, should you ever be in a situation where you are unable to make your own medical decisions.   From the [place] Advanced Directive Website: Advance Health Care Directives are legal documents in which you give written instructions about your health care if, in the future, you cannot speak for yourself.   A health care power of attorney allows you to name a person you trust to make your health care decisions if you cannot make them yourself. A declaration of a desire for a natural death (or living will) is document, which states that you desire not to have your life prolonged by extraordinary measures if you have a terminal or incurable illness or if you are in a vegetative state. An advance instruction for mental health treatment makes a declaration of instructions, information and preferences regarding your mental health treatment. It also states that you are aware that the advance instruction authorizes a mental health treatment provider to act according to your wishes. It may also outline your consent or refusal of mental health treatment. A declaration of an anatomical gift allows anyone over the age of 23 to make a gift by will, organ donor card or other document.   Please see the following website or an elder law attorney for forms, FAQs and for completion of advanced directives: [place]  Print production planner Health Care Directives Advance Health Care Directives (http://guzman.[email or web address]/)  Or copy and paste the following  to your web browser: [email or web address]          Maurie Southern, DO

## 2023-10-17 ENCOUNTER — Encounter: Payer: Self-pay | Admitting: Family Medicine

## 2023-10-24 ENCOUNTER — Encounter: Payer: Self-pay | Admitting: Gastroenterology

## 2023-11-03 ENCOUNTER — Other Ambulatory Visit: Payer: Self-pay | Admitting: Family Medicine

## 2023-11-19 ENCOUNTER — Encounter: Payer: Self-pay | Admitting: Gastroenterology

## 2023-11-19 ENCOUNTER — Ambulatory Visit (AMBULATORY_SURGERY_CENTER)

## 2023-11-19 VITALS — Ht 65.0 in | Wt 150.0 lb

## 2023-11-19 DIAGNOSIS — Z860100 Personal history of colon polyps, unspecified: Secondary | ICD-10-CM

## 2023-11-19 MED ORDER — NA SULFATE-K SULFATE-MG SULF 17.5-3.13-1.6 GM/177ML PO SOLN: 1.0000 | Freq: Once | ORAL | 0 refills | Status: AC

## 2023-11-19 NOTE — Progress Notes (Signed)

## 2023-11-20 DIAGNOSIS — K080 Exfoliation of teeth due to systemic causes: Secondary | ICD-10-CM | POA: Diagnosis not present

## 2023-12-06 ENCOUNTER — Ambulatory Visit: Admitting: Gastroenterology

## 2023-12-06 ENCOUNTER — Other Ambulatory Visit: Payer: Self-pay | Admitting: Gastroenterology

## 2023-12-06 ENCOUNTER — Encounter: Payer: Self-pay | Admitting: Gastroenterology

## 2023-12-06 VITALS — BP 128/80 | HR 74 | Temp 97.7°F | Resp 13 | Ht 65.0 in | Wt 150.0 lb

## 2023-12-06 DIAGNOSIS — K641 Second degree hemorrhoids: Secondary | ICD-10-CM

## 2023-12-06 DIAGNOSIS — D122 Benign neoplasm of ascending colon: Secondary | ICD-10-CM

## 2023-12-06 DIAGNOSIS — D128 Benign neoplasm of rectum: Secondary | ICD-10-CM | POA: Diagnosis not present

## 2023-12-06 DIAGNOSIS — Z1211 Encounter for screening for malignant neoplasm of colon: Secondary | ICD-10-CM | POA: Diagnosis not present

## 2023-12-06 DIAGNOSIS — K635 Polyp of colon: Secondary | ICD-10-CM

## 2023-12-06 DIAGNOSIS — Z860101 Personal history of adenomatous and serrated colon polyps: Secondary | ICD-10-CM | POA: Diagnosis not present

## 2023-12-06 DIAGNOSIS — Q439 Congenital malformation of intestine, unspecified: Secondary | ICD-10-CM | POA: Diagnosis not present

## 2023-12-06 DIAGNOSIS — K5730 Diverticulosis of large intestine without perforation or abscess without bleeding: Secondary | ICD-10-CM | POA: Diagnosis not present

## 2023-12-06 DIAGNOSIS — Z860100 Personal history of colon polyps, unspecified: Secondary | ICD-10-CM

## 2023-12-06 DIAGNOSIS — K644 Residual hemorrhoidal skin tags: Secondary | ICD-10-CM | POA: Diagnosis not present

## 2023-12-06 DIAGNOSIS — K6289 Other specified diseases of anus and rectum: Secondary | ICD-10-CM | POA: Diagnosis not present

## 2023-12-06 MED ORDER — SODIUM CHLORIDE 0.9 % IV SOLN: 500.0000 mL | Freq: Once | INTRAVENOUS | Status: DC

## 2023-12-06 NOTE — Progress Notes (Signed)
 Vss nad trans to pacu

## 2023-12-06 NOTE — Progress Notes (Signed)
 GASTROENTEROLOGY PROCEDURE H&P NOTE   Primary Care Physician: Micheal Wolm ORN, MD  HPI: Jenny Ewing is a 67 y.o. female who presents for Colonoscopy for surveillance of previous advanced adenomas.  Past Medical History:  Diagnosis Date   Allergy    Anxiety    DEPRESSION 01/27/2009   Past Surgical History:  Procedure Laterality Date   APPENDECTOMY  07/29/2017   LAPROSCOPIC   COLONOSCOPY  2008?   in [place]-Normal exam per pt-pt unable to drink all the Goly prep-had vomiting   LAPAROSCOPIC APPENDECTOMY N/A 07/29/2017   Procedure: APPENDECTOMY LAPAROSCOPIC;  Surgeon: Belinda Cough, MD;  Location: [facility] OR;  Service: General;  Laterality: N/A;   TUBAL LIGATION     Current Outpatient Medications  Medication Sig Dispense Refill   clobetasol (TEMOVATE) 0.05 % external solution      Cod Liver Oil CAPS Take by mouth.     MAGNESIUM CITRATE PO Take 135 mg by mouth.     Specialty Vitamins Products (COLLAGEN ULTRA PO) Take by mouth.     citalopram  (CELEXA ) 40 MG tablet TAKE 1 TABLET BY MOUTH DAILY (Patient not taking: No sig reported) 90 tablet 1   Current Facility-Administered Medications  Medication Dose Route Frequency Provider Last Rate Last Admin   0.9 %  sodium chloride  infusion  500 mL Intravenous Once Mansouraty, Shant Hence Jr., MD        Current Outpatient Medications:    clobetasol (TEMOVATE) 0.05 % external solution, , Disp: , Rfl:    Cod Liver Oil CAPS, Take by mouth., Disp: , Rfl:    MAGNESIUM CITRATE PO, Take 135 mg by mouth., Disp: , Rfl:    Specialty Vitamins Products (COLLAGEN ULTRA PO), Take by mouth., Disp: , Rfl:    citalopram  (CELEXA ) 40 MG tablet, TAKE 1 TABLET BY MOUTH DAILY (Patient not taking: No sig reported), Disp: 90 tablet, Rfl: 1  Current Facility-Administered Medications:    0.9 %  sodium chloride  infusion, 500 mL, Intravenous, Once, Mansouraty, Aloha Raddle., MD Allergies  Allergen Reactions   Penicillins Hives    REACTION: Hives   Family  History  Problem Relation Age of Onset   Hypertension Mother    Hypertension Father    Colon cancer Paternal Grandfather    Colon polyps Paternal Grandfather    Cancer Other        colon   Hypertension Other    Stroke Other    Esophageal cancer Neg Hx    Rectal cancer Neg Hx    Stomach cancer Neg Hx    Breast cancer Neg Hx    Social History   Socioeconomic History   Marital status: Married    Spouse name: Not on file   Number of children: Not on file   Years of education: Not on file   Highest education level: Bachelor's degree (e.g., BA, AB, BS)  Occupational History   Not on file  Tobacco Use   Smoking status: Never   Smokeless tobacco: Never  Vaping Use   Vaping status: Never Used  Substance and Sexual Activity   Alcohol use: Yes    Alcohol/week: 5.0 standard drinks of alcohol    Types: 5 Standard drinks or equivalent per week   Drug use: Not Currently    Frequency: 2.0 times per week    Types: Marijuana   Sexual activity: Not on file  Other Topics Concern   Not on file  Social History Narrative   Not on file   Social Drivers of Health  Financial Resource Strain: Low Risk  (08/31/2022)   Overall Financial Resource Strain (CARDIA)    Difficulty of Paying Living Expenses: Not hard at all  Food Insecurity: No Food Insecurity (08/31/2022)   Hunger Vital Sign    Worried About Running Out of Food in the Last Year: Never true    Ran Out of Food in the Last Year: Never true  Transportation Needs: No Transportation Needs (08/31/2022)   PRAPARE - Administrator, Civil Service (Medical): No    Lack of Transportation (Non-Medical): No  Physical Activity: Insufficiently Active (08/31/2022)   Exercise Vital Sign    Days of Exercise per Week: 2 days    Minutes of Exercise per Session: 60 min  Stress: No Stress Concern Present (08/31/2022)   [employer] of Occupational Health - Occupational Stress Questionnaire    Feeling of Stress : Not at all  Social  Connections: Moderately Isolated (08/31/2022)   Social Connection and Isolation Panel    Frequency of Communication with Friends and Family: More than three times a week    Frequency of Social Gatherings with Friends and Family: More than three times a week    Attends Religious Services: Never    Database administrator or Organizations: No    Attends Banker Meetings: Never    Marital Status: Married  Catering manager Violence: Not At Risk (08/31/2022)   Humiliation, Afraid, Rape, and Kick questionnaire    Fear of Current or Ex-Partner: No    Emotionally Abused: No    Physically Abused: No    Sexually Abused: No    Physical Exam: Today's Vitals   12/06/23 0734  BP: 130/87  Pulse: 86  Temp: 97.7 F (36.5 C)  SpO2: 97%  Weight: 150 lb (68 kg)  Height: 5' 5 (1.651 m)   Body mass index is 24.96 kg/m. GEN: NAD EYE: Sclerae anicteric ENT: MMM CV: Non-tachycardic GI: Soft, NT/ND NEURO:  Alert & Oriented x 3  Lab Results: No results for input(s): WBC, HGB, HCT, PLT in the last 72 hours. BMET No results for input(s): NA, K, CL, CO2, GLUCOSE, BUN, CREATININE, CALCIUM in the last 72 hours. LFT No results for input(s): PROT, ALBUMIN, AST, ALT, ALKPHOS, BILITOT, BILIDIR, IBILI in the last 72 hours. PT/INR No results for input(s): LABPROT, INR in the last 72 hours.   Impression / Plan: This is a 68 y.o.female who presents for Colonoscopy for surveillance of previous advanced adenomas.  The risks and benefits of endoscopic evaluation/treatment were discussed with the patient and/or family; these include but are not limited to the risk of perforation, infection, bleeding, missed lesions, lack of diagnosis, severe illness requiring hospitalization, as well as anesthesia and sedation related illnesses.  The patient's history has been reviewed, patient examined, no change in status, and deemed stable for procedure.  The patient  and/or family is agreeable to proceed.    Aloha Finner, MD [facility] Advanced [facility]oscopy Office # [phone]

## 2023-12-06 NOTE — Op Note (Signed)
 [facility] Patient Name: Jenny Ewing Procedure Date: 12/06/2023 8:26 AM MRN: [id number] Endoscopist: Aloha Finner , MD, [id number] Age: 68 Referring MD:  Date of Birth: [birth year] Gender: Female Account #: [id number] Procedure:                Colonoscopy Indications:              Surveillance: Personal history of adenomatous                            polyps on last colonoscopy 3 years ago, High risk                            colon cancer surveillance: Personal history of                            adenoma (10 mm or greater in size) Medicines:                Monitored Anesthesia Care Procedure:                Pre-Anesthesia Assessment:                           - Prior to the procedure, a History and Physical                            was performed, and patient medications and                            allergies were reviewed. The patient's tolerance of                            previous anesthesia was also reviewed. The risks                            and benefits of the procedure and the sedation                            options and risks were discussed with the patient.                            All questions were answered, and informed consent                            was obtained. Prior Anticoagulants: The patient has                            taken no anticoagulant or antiplatelet agents. ASA                            Grade Assessment: II - A patient with mild systemic                            disease. After reviewing the risks and benefits,  the patient was deemed in satisfactory condition to                            undergo the procedure.                           After obtaining informed consent, the colonoscope                            was passed under direct vision. Throughout the                            procedure, the patient's blood pressure, pulse, and                            oxygen saturations were  monitored continuously. The                            Olympus Scope SN: [id number] was introduced through                            the anus and advanced to the the cecum, identified                            by appendiceal orifice and ileocecal valve. The                            colonoscopy was performed without difficulty. The                            patient tolerated the procedure. The quality of the                            bowel preparation was adequate. The ileocecal                            valve, appendiceal orifice, and rectum were                            photographed. Scope In: 8:36:21 AM Scope Out: 8:51:58 AM Scope Withdrawal Time: 0 hours 10 minutes 54 seconds  Total Procedure Duration: 0 hours 15 minutes 37 seconds  Findings:                 The digital rectal exam findings include                            hemorrhoids. Pertinent negatives include no                            palpable rectal lesions.                           The left colon was mildly tortuous.  Two sessile polyps were found in the rectum and                            ascending colon. The polyps were 3 to 4 mm in size.                            These polyps were removed with a cold snare.                            Resection and retrieval were complete.                           Multiple large-mouthed and small-mouthed                            diverticula were found in the recto-sigmoid colon                            and sigmoid colon.                           Normal mucosa was found in the entire colon                            otherwise.                           Anal papilla was hypertrophied.                           Non-bleeding non-thrombosed internal hemorrhoids                            were found during retroflexion, during perianal                            exam and during digital exam. The hemorrhoids were                            Grade II  (internal hemorrhoids that prolapse but                            reduce spontaneously). Complications:            No immediate complications. Estimated Blood Loss:     Estimated blood loss was minimal. Impression:               - Hemorrhoids found on digital rectal exam.                           - Tortuous left colon.                           - Two 3 to 4 mm polyps in the rectum and in the  ascending colon, removed with a cold snare.                            Resected and retrieved.                           - Diverticulosis in the recto-sigmoid colon and in                            the sigmoid colon.                           - Normal mucosa in the entire examined colon.                           - Anal papilla was otherwise hypertrophied.                           - Non-bleeding non-thrombosed internal hemorrhoids. Recommendation:           - The patient will be observed post-procedure,                            until all discharge criteria are met.                           - Discharge patient to home.                           - Patient has a contact number available for                            emergencies. The signs and symptoms of potential                            delayed complications were discussed with the                            patient. Return to normal activities tomorrow.                            Written discharge instructions were provided to the                            patient.                           - High fiber diet.                           - Use FiberCon 1-2 tablets PO daily.                           - Continue present medications.                           - Await pathology results.                           -  Repeat colonoscopy in 5 years for surveillance,                            no matter pathology as result of history of                            previous advanced adenoma.                           - The  findings and recommendations were discussed                            with the patient.                           - The findings and recommendations were discussed                            with the patient's family. Aloha Finner, MD 12/06/2023 8:56:28 AM

## 2023-12-06 NOTE — Progress Notes (Signed)
 Called to room to assist during endoscopic procedure.  Patient ID and intended procedure confirmed with present staff. Received instructions for my participation in the procedure from the performing physician.

## 2023-12-06 NOTE — Progress Notes (Signed)
 Pt's states no medical or surgical changes since previsit or office visit.

## 2023-12-06 NOTE — Patient Instructions (Signed)
 Discharge instructions given. Handouts on polyps,Diverticulosis and Hemorrhoids. Resume previous medications. See recommendations on procedure report. YOU HAD AN ENDOSCOPIC PROCEDURE TODAY AT THE [facility]:   Refer to the procedure report that was given to you for any specific questions about what was found during the examination.  If the procedure report does not answer your questions, please call your gastroenterologist to clarify.  If you requested that your care partner not be given the details of your procedure findings, then the procedure report has been included in a sealed envelope for you to review at your convenience later.  YOU SHOULD EXPECT: Some feelings of bloating in the abdomen. Passage of more gas than usual.  Walking can help get rid of the air that was put into your GI tract during the procedure and reduce the bloating. If you had a lower endoscopy (such as a colonoscopy or flexible sigmoidoscopy) you may notice spotting of blood in your stool or on the toilet paper. If you underwent a bowel prep for your procedure, you may not have a normal bowel movement for a few days.  Please Note:  You might notice some irritation and congestion in your nose or some drainage.  This is from the oxygen used during your procedure.  There is no need for concern and it should clear up in a day or so.  SYMPTOMS TO REPORT IMMEDIATELY:  Following lower endoscopy (colonoscopy or flexible sigmoidoscopy):  Excessive amounts of blood in the stool  Significant tenderness or worsening of abdominal pains  Swelling of the abdomen that is new, acute  Fever of 100F or higher   For urgent or emergent issues, a gastroenterologist can be reached at any hour by calling [phone]. Do not use MyChart messaging for urgent concerns.    DIET:  We do recommend a small meal at first, but then you may proceed to your regular diet.  Drink plenty of fluids but you should avoid alcoholic beverages  for 24 hours.  ACTIVITY:  You should plan to take it easy for the rest of today and you should NOT DRIVE or use heavy machinery until tomorrow (because of the sedation medicines used during the test).    FOLLOW UP: Our staff will call the number listed on your records the next business day following your procedure.  We will call around 7:15- 8:00 am to check on you and address any questions or concerns that you may have regarding the information given to you following your procedure. If we do not reach you, we will leave a message.     If any biopsies were taken you will be contacted by phone or by letter within the next 1-3 weeks.  Please call us  at [phone] if you have not heard about the biopsies in 3 weeks.    SIGNATURES/CONFIDENTIALITY: You and/or your care partner have signed paperwork which will be entered into your electronic medical record.  These signatures attest to the fact that that the information above on your After Visit Summary has been reviewed and is understood.  Full responsibility of the confidentiality of this discharge information lies with you and/or your care-partner.

## 2023-12-09 ENCOUNTER — Telehealth: Payer: Self-pay

## 2023-12-09 NOTE — Telephone Encounter (Signed)
  Follow up Call-     12/06/2023    7:35 AM  Call back number  Post procedure Call Back phone  # [phone]  Permission to leave phone message Yes     Patient questions:  Do you have a fever, pain , or abdominal swelling? No. Pain Score  0 *  Have you tolerated food without any problems? Yes.    Have you been able to return to your normal activities? Yes.    Do you have any questions about your discharge instructions: Diet   No. Medications  No. Follow up visit  No.  Do you have questions or concerns about your Care? No.  Actions: * If pain score is 4 or above: No action needed, pain <4.

## 2023-12-10 ENCOUNTER — Ambulatory Visit: Payer: Self-pay | Admitting: Gastroenterology

## 2023-12-10 LAB — SURGICAL PATHOLOGY

## 2023-12-27 ENCOUNTER — Encounter: Payer: Self-pay | Admitting: Gastroenterology

## 2024-02-07 ENCOUNTER — Other Ambulatory Visit: Payer: Self-pay

## 2024-02-07 ENCOUNTER — Encounter (HOSPITAL_COMMUNITY): Payer: Self-pay

## 2024-02-07 ENCOUNTER — Emergency Department (HOSPITAL_COMMUNITY)
Admission: EM | Admit: 2024-02-07 | Discharge: 2024-02-07 | Disposition: A | Discharge status: Home / Self Care | Attending: Emergency Medicine | Admitting: Emergency Medicine

## 2024-02-07 ENCOUNTER — Emergency Department (HOSPITAL_COMMUNITY)

## 2024-02-07 DIAGNOSIS — S32011A Stable burst fracture of first lumbar vertebra, initial encounter for closed fracture: Secondary | ICD-10-CM | POA: Diagnosis not present

## 2024-02-07 DIAGNOSIS — M4856XA Collapsed vertebra, not elsewhere classified, lumbar region, initial encounter for fracture: Secondary | ICD-10-CM | POA: Diagnosis not present

## 2024-02-07 DIAGNOSIS — S32001A Stable burst fracture of unspecified lumbar vertebra, initial encounter for closed fracture: Secondary | ICD-10-CM | POA: Diagnosis not present

## 2024-02-07 DIAGNOSIS — W010XXA Fall on same level from slipping, tripping and stumbling without subsequent striking against object, initial encounter: Secondary | ICD-10-CM | POA: Insufficient documentation

## 2024-02-07 DIAGNOSIS — M549 Dorsalgia, unspecified: Secondary | ICD-10-CM | POA: Diagnosis not present

## 2024-02-07 DIAGNOSIS — W19XXXA Unspecified fall, initial encounter: Secondary | ICD-10-CM | POA: Diagnosis not present

## 2024-02-07 DIAGNOSIS — M5459 Other low back pain: Secondary | ICD-10-CM | POA: Diagnosis not present

## 2024-02-07 DIAGNOSIS — S3992XA Unspecified injury of lower back, initial encounter: Secondary | ICD-10-CM | POA: Diagnosis not present

## 2024-02-07 LAB — CBC WITH DIFFERENTIAL/PLATELET
Abs Immature Granulocytes: 0.07 K/uL (ref 0.00–0.07)
Basophils Absolute: 0 K/uL (ref 0.0–0.1)
Basophils Relative: 0 %
Eosinophils Absolute: 0.1 K/uL (ref 0.0–0.5)
Eosinophils Relative: 1 %
HCT: 41.7 % (ref 36.0–46.0)
Hemoglobin: 13.7 g/dL (ref 12.0–15.0)
Immature Granulocytes: 1 %
Lymphocytes Relative: 22 %
Lymphs Abs: 1.7 K/uL (ref 0.7–4.0)
MCH: 31.1 pg (ref 26.0–34.0)
MCHC: 32.9 g/dL (ref 30.0–36.0)
MCV: 94.6 fL (ref 80.0–100.0)
Monocytes Absolute: 0.7 K/uL (ref 0.1–1.0)
Monocytes Relative: 9 %
Neutro Abs: 5.4 K/uL (ref 1.7–7.7)
Neutrophils Relative %: 67 %
Platelets: 244 K/uL (ref 150–400)
RBC: 4.41 MIL/uL (ref 3.87–5.11)
RDW: 13.1 % (ref 11.5–15.5)
WBC: 8 K/uL (ref 4.0–10.5)
nRBC: 0 % (ref 0.0–0.2)

## 2024-02-07 LAB — BASIC METABOLIC PANEL WITH GFR
Anion gap: 13 (ref 5–15)
BUN: 10 mg/dL (ref 8–23)
CO2: 21 mmol/L — ABNORMAL LOW (ref 22–32)
Calcium: 9.1 mg/dL (ref 8.9–10.3)
Chloride: 104 mmol/L (ref 98–111)
Creatinine, Ser: 0.87 mg/dL (ref 0.44–1.00)
GFR, Estimated: >60 mL/min (ref >60)
Glucose, Bld: 118 mg/dL — ABNORMAL HIGH (ref 70–99)
Potassium: 3.4 mmol/L — ABNORMAL LOW (ref 3.5–5.1)
Sodium: 138 mmol/L (ref 135–145)

## 2024-02-07 MED ORDER — KETOROLAC TROMETHAMINE 30 MG/ML IJ SOLN
30.0000 mg | Freq: Once | INTRAMUSCULAR | Status: AC
  Administered 2024-02-07: 30 mg via INTRAVENOUS
  Filled 2024-02-07: qty 1

## 2024-02-07 MED ORDER — HYDROMORPHONE HCL 1 MG/ML IJ SOLN
1.0000 mg | Freq: Once | INTRAMUSCULAR | Status: AC
  Administered 2024-02-07: 1 mg via INTRAVENOUS
  Filled 2024-02-07: qty 1

## 2024-02-07 MED ORDER — ONDANSETRON HCL 4 MG/2ML IJ SOLN
4.0000 mg | Freq: Once | INTRAMUSCULAR | Status: AC
  Administered 2024-02-07: 4 mg via INTRAVENOUS
  Filled 2024-02-07: qty 2

## 2024-02-07 MED ORDER — OXYCODONE-ACETAMINOPHEN 5-325 MG PO TABS: 1.0000 | ORAL_TABLET | Freq: Four times a day (QID) | ORAL | 0 refills | Status: AC | PRN

## 2024-02-07 MED ORDER — CYCLOBENZAPRINE HCL 10 MG PO TABS
5.0000 mg | ORAL_TABLET | Freq: Once | ORAL | Status: AC
  Administered 2024-02-07: 5 mg via ORAL
  Filled 2024-02-07: qty 1

## 2024-02-07 MED ORDER — CYCLOBENZAPRINE HCL 5 MG PO TABS: 5.0000 mg | ORAL_TABLET | Freq: Three times a day (TID) | ORAL | 0 refills | Status: AC | PRN

## 2024-02-07 MED ORDER — MORPHINE SULFATE (PF) 4 MG/ML IV SOLN
4.0000 mg | Freq: Once | INTRAVENOUS | Status: AC
  Administered 2024-02-07: 4 mg via INTRAVENOUS
  Filled 2024-02-07: qty 1

## 2024-02-07 NOTE — Discharge Instructions (Addendum)
 Please wear your brace when you are sitting upright or standing. You do not need to wear the brace when lying down, sleeping, getting up to go to the bathroom in the middle of the night, or bathing  You can take Tylenol  or Motrin for pain  You may apply lidocaine  patch which is over-the-counter  Please also take Flexeril 5 mg 3 times daily as needed  Take Percocet 1 tab every 6 hours as needed for severe pain  Please follow-up with Dr. Darnella  Return to ER if you have worse back pain or trouble walking or numbness or weakness

## 2024-02-07 NOTE — ED Triage Notes (Signed)
 PT BIB [facility] after PT fell from attic door/stairs, was standing two steps up and 'fell on butt'. Denies head injury, LOC. Endorses lower back pain 8/10 pain. Aox4. Ems vs: 142/90 bp, 70 hr, 97% Spo2 RA

## 2024-02-07 NOTE — Progress Notes (Signed)
 68 y/o F fell from attic stairs, found to have L1 compression fracture  Recommend TLSO brace and outpatient follow-up. Number and instructions left in chart

## 2024-02-07 NOTE — ED Provider Notes (Signed)
 Jenny Ewing   CSN: [id number] Arrival date & time: 02/07/24  1635     Patient presents with: Jenny Ewing is a 68 y.o. female otherwise healthy here presenting with fall.  She states that she was climbing down from the attic to get some camping equipment.  She states that she slipped and fell off 6 foot and fell directly on her back.  She states that she has severe back pain afterwards.  Denies any loss of consciousness or head injury.   The history is provided by the patient.       Prior to Admission medications   Medication Sig Start Date End Date Taking? Authorizing Provider  citalopram  (CELEXA ) 40 MG tablet TAKE 1 TABLET BY MOUTH DAILY Patient not taking: No sig reported 11/04/23   Micheal Wolm ORN, MD  clobetasol (TEMOVATE) 0.05 % external solution  06/19/23   [provider]  [facility] Liver Oil CAPS Take by mouth.    [provider]  MAGNESIUM CITRATE PO Take 135 mg by mouth.    [provider]  Specialty Vitamins Products (COLLAGEN ULTRA PO) Take by mouth.    [provider]    Allergies: Penicillins    Review of Systems  Musculoskeletal:  Positive for back pain.  All other systems reviewed and are negative.   Updated Vital Signs BP 138/82   Pulse 74   Temp 97.7 F (36.5 C) (Oral)   Resp 12   Ht 5' 5 (1.651 m)   Wt 65.8 kg   SpO2 100%   BMI 24.13 kg/m   Physical Exam Vitals and nursing Ewing reviewed.  Constitutional:      Comments: Uncomfortable  HENT:     Head: Normocephalic and atraumatic.     Nose: Nose normal.     Mouth/Throat:     Mouth: Mucous membranes are moist.  Eyes:     Extraocular Movements: Extraocular movements intact.     Pupils: Pupils are equal, round, and reactive to light.  Cardiovascular:     Rate and Rhythm: Normal rate and regular rhythm.     Pulses: Normal pulses.     Heart sounds: Normal heart sounds.  Pulmonary:     Effort:  Pulmonary effort is normal.     Breath sounds: Normal breath sounds.  Abdominal:     General: Abdomen is flat.     Palpations: Abdomen is soft.  Musculoskeletal:     Cervical back: Normal range of motion and neck supple.     Comments: + Lower lumbar tenderness.  Normal range of motion bilateral hips.  No obvious saddle anesthesia  Skin:    General: Skin is warm.     Capillary Refill: Capillary refill takes less than 2 seconds.  Neurological:     General: No focal deficit present.     Mental Status: She is alert.     Comments: No saddle anesthesia and normal reflexes bilateral knees  Psychiatric:        Mood and Affect: Mood normal.        Behavior: Behavior normal.     (all labs ordered are listed, but only abnormal results are displayed) Labs Reviewed  CBC WITH DIFFERENTIAL/PLATELET  BASIC METABOLIC PANEL WITH GFR    EKG: None  Radiology: No results found.   Procedures   Medications Ordered in the ED  morphine  (PF) 4 MG/ML injection 4 mg (4 mg Intravenous Given 02/07/24 1721)  ondansetron  (ZOFRAN ) injection 4 mg (4 mg Intravenous Given 02/07/24 1719)                                    Medical Decision Making Jenny Ewing is a 68 y.o. female here presenting with back pain after fall. Plan to get CT lumbar spine to rule out fracture.  Will get basic lab work and give pain medicine as well.  7:06 PM I reviewed patient's labs and they were unremarkable.  CT showed L1 fracture.  Patient is still in significant pain.  Will order second round of pain medicine and muscle relaxant.  Consult to neurosurgery  7:42 PM I discussed with Dr. Darnella from neurosurgery.  He reviewed the imaging and states that this is nonoperative.  Recommend TLSO brace.  After second round of pain medicine patient states that her pain is much improved.  Will discharge home with pain medicine and muscle relaxant.  She can follow-up with Dr. Darnella outpatient   Problems Addressed: Closed stable burst  fracture of first lumbar vertebra, initial encounter [facility]): acute illness or injury  Amount and/or Complexity of Data Reviewed Labs: ordered. Decision-making details documented in ED Course. Radiology: ordered.  Risk Prescription drug management.     Final diagnoses:  None    ED Discharge Orders     None          Patt Alm Macho, MD 02/07/24 1943

## 2024-02-07 NOTE — Progress Notes (Signed)
 Orthopedic Tech Progress Note Patient Details:  Jenny Ewing Apr 06, 1955 [id number]  Ortho Devices Type of Ortho Device: Thoracolumbar corset (TLSO) Ortho Device/Splint Interventions: Ordered, Application, Adjustment   Post Interventions Patient Tolerated: Well Instructions Provided: Care of device, Adjustment of device  Adine Jenny Ewing 02/07/2024, 7:56 PM

## 2024-02-21 DIAGNOSIS — S32010A Wedge compression fracture of first lumbar vertebra, initial encounter for closed fracture: Secondary | ICD-10-CM | POA: Diagnosis not present

## 2024-02-23 ENCOUNTER — Other Ambulatory Visit (HOSPITAL_BASED_OUTPATIENT_CLINIC_OR_DEPARTMENT_OTHER): Payer: Self-pay

## 2024-02-23 DIAGNOSIS — Z8739 Personal history of other diseases of the musculoskeletal system and connective tissue: Secondary | ICD-10-CM

## 2024-04-23 ENCOUNTER — Encounter: Payer: Self-pay | Admitting: Family Medicine

## 2024-05-06 ENCOUNTER — Other Ambulatory Visit: Payer: Self-pay | Admitting: Family Medicine
